# Patient Record
Sex: Female | Born: 1980 | Race: Black or African American | Hispanic: No | Marital: Single | Smoking: Current every day smoker
Health system: Southern US, Community
[De-identification: ages and names within clinical notes are randomized; demographics above are authoritative.]

## PROBLEM LIST (undated history)

## (undated) ENCOUNTER — Inpatient Hospital Stay (HOSPITAL_COMMUNITY): Payer: Self-pay

## (undated) DIAGNOSIS — N200 Calculus of kidney: Secondary | ICD-10-CM

## (undated) DIAGNOSIS — Z8619 Personal history of other infectious and parasitic diseases: Secondary | ICD-10-CM

## (undated) DIAGNOSIS — D649 Anemia, unspecified: Secondary | ICD-10-CM

## (undated) HISTORY — PX: WISDOM TOOTH EXTRACTION: SHX21

---
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
## 1999-05-21 ENCOUNTER — Emergency Department (HOSPITAL_COMMUNITY): Admission: EM | Admit: 1999-05-21 | Discharge: 1999-05-21 | Payer: Self-pay | Admitting: Emergency Medicine

## 1999-05-21 ENCOUNTER — Encounter: Payer: Self-pay | Admitting: Emergency Medicine

## 2000-07-01 ENCOUNTER — Inpatient Hospital Stay (HOSPITAL_COMMUNITY): Admission: AD | Admit: 2000-07-01 | Discharge: 2000-07-01 | Payer: Self-pay | Admitting: Obstetrics

## 2000-07-12 ENCOUNTER — Inpatient Hospital Stay (HOSPITAL_COMMUNITY): Admission: AD | Admit: 2000-07-12 | Discharge: 2000-07-12 | Payer: Self-pay | Admitting: Obstetrics

## 2001-03-29 ENCOUNTER — Inpatient Hospital Stay (HOSPITAL_COMMUNITY): Admission: AD | Admit: 2001-03-29 | Discharge: 2001-03-29 | Payer: Self-pay | Admitting: Obstetrics & Gynecology

## 2001-09-15 ENCOUNTER — Inpatient Hospital Stay (HOSPITAL_COMMUNITY): Admission: AD | Admit: 2001-09-15 | Discharge: 2001-09-15 | Payer: Self-pay | Admitting: *Deleted

## 2002-04-22 ENCOUNTER — Inpatient Hospital Stay (HOSPITAL_COMMUNITY): Admission: AD | Admit: 2002-04-22 | Discharge: 2002-04-22 | Payer: Self-pay | Admitting: Anesthesiology

## 2002-04-25 ENCOUNTER — Inpatient Hospital Stay (HOSPITAL_COMMUNITY): Admission: AD | Admit: 2002-04-25 | Discharge: 2002-04-25 | Payer: Self-pay | Admitting: Obstetrics and Gynecology

## 2002-10-01 ENCOUNTER — Inpatient Hospital Stay (HOSPITAL_COMMUNITY): Admission: AD | Admit: 2002-10-01 | Discharge: 2002-10-01 | Payer: Self-pay | Admitting: *Deleted

## 2003-01-08 ENCOUNTER — Encounter: Payer: Self-pay | Admitting: Emergency Medicine

## 2003-01-08 ENCOUNTER — Emergency Department (HOSPITAL_COMMUNITY): Admission: AD | Admit: 2003-01-08 | Discharge: 2003-01-08 | Payer: Self-pay | Admitting: Emergency Medicine

## 2003-01-15 ENCOUNTER — Emergency Department (HOSPITAL_COMMUNITY): Admission: EM | Admit: 2003-01-15 | Discharge: 2003-01-15 | Payer: Self-pay | Admitting: Emergency Medicine

## 2003-01-17 ENCOUNTER — Emergency Department (HOSPITAL_COMMUNITY): Admission: EM | Admit: 2003-01-17 | Discharge: 2003-01-17 | Payer: Self-pay | Admitting: Emergency Medicine

## 2003-06-30 ENCOUNTER — Inpatient Hospital Stay (HOSPITAL_COMMUNITY): Admission: AD | Admit: 2003-06-30 | Discharge: 2003-06-30 | Payer: Self-pay | Admitting: Obstetrics and Gynecology

## 2004-02-02 ENCOUNTER — Observation Stay (HOSPITAL_COMMUNITY): Admission: AD | Admit: 2004-02-02 | Discharge: 2004-02-03 | Payer: Self-pay | Admitting: Obstetrics & Gynecology

## 2005-05-03 ENCOUNTER — Emergency Department (HOSPITAL_COMMUNITY): Admission: EM | Admit: 2005-05-03 | Discharge: 2005-05-03 | Payer: Self-pay | Admitting: Emergency Medicine

## 2005-05-06 ENCOUNTER — Inpatient Hospital Stay (HOSPITAL_COMMUNITY): Admission: AD | Admit: 2005-05-06 | Discharge: 2005-05-06 | Payer: Self-pay | Admitting: *Deleted

## 2005-05-15 ENCOUNTER — Inpatient Hospital Stay (HOSPITAL_COMMUNITY): Admission: AD | Admit: 2005-05-15 | Discharge: 2005-05-15 | Payer: Self-pay | Admitting: *Deleted

## 2005-08-23 ENCOUNTER — Emergency Department (HOSPITAL_COMMUNITY): Admission: EM | Admit: 2005-08-23 | Discharge: 2005-08-23 | Payer: Self-pay | Admitting: Emergency Medicine

## 2006-02-16 ENCOUNTER — Inpatient Hospital Stay (HOSPITAL_COMMUNITY): Admission: AD | Admit: 2006-02-16 | Discharge: 2006-02-16 | Payer: Self-pay | Admitting: Gynecology

## 2007-01-03 ENCOUNTER — Emergency Department (HOSPITAL_COMMUNITY): Admission: EM | Admit: 2007-01-03 | Discharge: 2007-01-03 | Payer: Self-pay | Admitting: Emergency Medicine

## 2009-01-16 ENCOUNTER — Emergency Department (HOSPITAL_COMMUNITY): Admission: EM | Admit: 2009-01-16 | Discharge: 2009-01-16 | Payer: Self-pay | Admitting: Emergency Medicine

## 2009-02-15 ENCOUNTER — Emergency Department (HOSPITAL_COMMUNITY): Admission: EM | Admit: 2009-02-15 | Discharge: 2009-02-15 | Payer: Self-pay | Admitting: Emergency Medicine

## 2009-12-16 ENCOUNTER — Emergency Department (HOSPITAL_COMMUNITY): Admission: EM | Admit: 2009-12-16 | Discharge: 2009-12-16 | Payer: Self-pay | Admitting: Emergency Medicine

## 2010-12-20 LAB — CBC
HCT: 38 % (ref 36.0–46.0)
Platelets: 296 10*3/uL (ref 150–400)
RDW: 13.2 % (ref 11.5–15.5)
WBC: 12.5 10*3/uL — ABNORMAL HIGH (ref 4.0–10.5)

## 2010-12-20 LAB — URINALYSIS, ROUTINE W REFLEX MICROSCOPIC
Ketones, ur: NEGATIVE mg/dL
Nitrite: NEGATIVE
Urobilinogen, UA: 0.2 mg/dL (ref 0.0–1.0)
pH: 7 (ref 5.0–8.0)

## 2010-12-20 LAB — BASIC METABOLIC PANEL
BUN: 10 mg/dL (ref 6–23)
Calcium: 8.9 mg/dL (ref 8.4–10.5)
Creatinine, Ser: 0.81 mg/dL (ref 0.4–1.2)
GFR calc non Af Amer: >60 mL/min (ref >60)
Potassium: 3.9 mEq/L (ref 3.5–5.1)

## 2010-12-20 LAB — DIFFERENTIAL
Basophils Absolute: 0 10*3/uL (ref 0.0–0.1)
Lymphocytes Relative: 4 % — ABNORMAL LOW (ref 12–46)
Lymphs Abs: 0.4 10*3/uL — ABNORMAL LOW (ref 0.7–4.0)
Neutro Abs: 11 10*3/uL — ABNORMAL HIGH (ref 1.7–7.7)
Neutrophils Relative %: 88 % — ABNORMAL HIGH (ref 43–77)

## 2010-12-20 LAB — WET PREP, GENITAL: Yeast Wet Prep HPF POC: NONE SEEN

## 2010-12-20 LAB — POCT PREGNANCY, URINE: Preg Test, Ur: NEGATIVE

## 2010-12-20 LAB — GC/CHLAMYDIA PROBE AMP, GENITAL: GC Probe Amp, Genital: NEGATIVE

## 2011-01-28 NOTE — Discharge Summary (Signed)
Destiny Schneider, CLAIR NO.:  [id number]   MEDICAL RECORD NO.:  [id number]                   PATIENT TYPE:  OBV   LOCATION:  9310                                 FACILITY:  [facility]   PHYSICIAN:  Conni Elliot, M.D.             DATE OF BIRTH:  Jul 13, 1981   DATE OF ADMISSION:  02/02/2004  DATE OF DISCHARGE:  02/03/2004                                 DISCHARGE SUMMARY   DISCHARGE DIAGNOSIS:  Pyelonephritis.   DISCHARGE MEDICATIONS:  Avelox 400 mg p.o. daily x6 days.   FOLLOW UP:  With primary care physician as needed.  Will return to MAU for  intractable vomiting, diarrhea, or fever.   HISTORY OF PRESENT ILLNESS:  This is a 30 year old female who presented to  MAU with a history of low back pain, right-sided, dysuria, urgency, but no  frequency.  She denies fever.   HOSPITAL COURSE:  Pyelonephritis.  The patient was admitted to the floor and  started on IV antibiotics.  The fever has defervesced, and she has been  afebrile.  She feels much better, and has greatly decreased right-sided back  pain, and desires to go home.  She has been afebrile, is well appearing,  tolerated lunch, and she is stable for discharge.   LABORATORY STUDIES:  GC and Chlamydia negative.  Urine culture has been re-  incubated for better growth.  Urine pregnancy is negative.  Wet prep  revealed a few clue cells and white blood cells, moderate bacteria.  Basic  metabolic panel within normal limits.  White count 6.5, hemoglobin and  hematocrit 13.0 and 39.6, platelet count 400.  Urinalysis with positive  nitrates, large leukocyte esterase, 11-20 red blood cells, white blood cells  too numerous to count.   DISCHARGE INSTRUCTIONS:  As described above.  A total 7-day course of  Avelox.  Dr. Irene Limbo dictating for Dr. Gavin Potters.     Conni Elliot, M.D.                   Conni Elliot, M.D.    ASG/MEDQ  D:  02/03/2004  T:  02/04/2004  Job:  [id number]

## 2011-10-11 ENCOUNTER — Encounter (HOSPITAL_COMMUNITY): Payer: Self-pay | Admitting: Emergency Medicine

## 2011-10-11 ENCOUNTER — Emergency Department (HOSPITAL_COMMUNITY)
Admission: EM | Admit: 2011-10-11 | Discharge: 2011-10-11 | Disposition: A | Discharge status: Home / Self Care | Payer: Self-pay | Attending: Emergency Medicine | Admitting: Emergency Medicine

## 2011-10-11 DIAGNOSIS — R112 Nausea with vomiting, unspecified: Secondary | ICD-10-CM | POA: Insufficient documentation

## 2011-10-11 DIAGNOSIS — R109 Unspecified abdominal pain: Secondary | ICD-10-CM | POA: Insufficient documentation

## 2011-10-11 DIAGNOSIS — K921 Melena: Secondary | ICD-10-CM | POA: Insufficient documentation

## 2011-10-11 DIAGNOSIS — R6883 Chills (without fever): Secondary | ICD-10-CM | POA: Insufficient documentation

## 2011-10-11 DIAGNOSIS — R197 Diarrhea, unspecified: Secondary | ICD-10-CM | POA: Insufficient documentation

## 2011-10-11 LAB — PREGNANCY, URINE: Preg Test, Ur: NEGATIVE

## 2011-10-11 MED ORDER — ONDANSETRON HCL 4 MG/2ML IJ SOLN
4.0000 mg | Freq: Once | INTRAMUSCULAR | Status: AC
  Administered 2011-10-11: 4 mg via INTRAVENOUS
  Filled 2011-10-11: qty 2

## 2011-10-11 MED ORDER — PROMETHAZINE HCL 12.5 MG PO TABS: 12.5000 mg | ORAL_TABLET | Freq: Four times a day (QID) | ORAL | Status: AC | PRN

## 2011-10-11 MED ORDER — SODIUM CHLORIDE 0.9 % IV BOLUS (SEPSIS)
1000.0000 mL | Freq: Once | INTRAVENOUS | Status: AC
  Administered 2011-10-11: 1000 mL via INTRAVENOUS

## 2011-10-11 MED ORDER — ACETAMINOPHEN-CODEINE #3 300-30 MG PO TABS: 1.0000 | ORAL_TABLET | Freq: Four times a day (QID) | ORAL | Status: AC | PRN

## 2011-10-11 MED ORDER — SODIUM CHLORIDE 0.9 % IV SOLN
Freq: Once | INTRAVENOUS | Status: AC
Start: 2011-10-11 — End: 2011-10-11
  Administered 2011-10-11: 1000 mL via INTRAVENOUS

## 2011-10-11 MED ORDER — FENTANYL CITRATE 0.05 MG/ML IJ SOLN
50.0000 ug | Freq: Once | INTRAMUSCULAR | Status: AC
  Administered 2011-10-11: 50 ug via INTRAVENOUS
  Filled 2011-10-11: qty 2

## 2011-10-11 NOTE — ED Provider Notes (Signed)
History     CSN: [id number]  Arrival date & time 10/11/11  1004   First MD Initiated Contact with Patient 10/11/11 1115      Chief Complaint  Patient presents with  . Abdominal Pain    (Consider location/radiation/quality/duration/timing/severity/associated sxs/prior treatment) HPI Comments: Patient reports that she was in her usual state of health yesterday and had a normal home dinner. She reports other people ate the same thing and they have not had any similar symptoms. Patient reports that early this morning she had some lower abdominal crampy discomfort which she likened to she eats too much candy and has some "rumbling" of her stomach. She reports that often she will have to have a bowel movement and then feels better. She did have a bowel movement which she reported was slightly soft and tender the unusual greenish color which was slightly abnormal. However afterward she did not feel much better and continued to have cramping in her abdomen. She reports that she tried to use the bathroom a few more times and strained but did not produce any further stool. She reports from straining that she may have seen a drop of blood from her rectum when she wiped. Since then she continues to have crampy abdominal pain which is now more diffuse and has been associated with several episodes of bilious emesis. She continues to feel nauseated and reports that she has had some cold chills although she is unsure of actual fevers. She denies the crampy pain is radiating into her chest or her flank or back area. She reports her last menstrual cycle was about 2 or 3 weeks ago and that she does not think that she is pregnant. She reports she is otherwise healthy and does not take any medications. She did not take any medicines for her symptoms this morning. She denies any prior abdominal surgeries. She reports that she drinks occasional alcohol.  Patient is a 31 y.o. female presenting with abdominal pain. The  history is provided by the patient.  Abdominal Pain The primary symptoms of the illness include abdominal pain, nausea, vomiting and diarrhea. The primary symptoms of the illness do not include dysuria, vaginal discharge or vaginal bleeding.  Additional symptoms associated with the illness include chills. Symptoms associated with the illness do not include back pain.    History reviewed. No pertinent past medical history.  History reviewed. No pertinent past surgical history.  History reviewed. No pertinent family history.  History  Substance Use Topics  . Smoking status: Not on file  . Smokeless tobacco: Not on file  . Alcohol Use: 0.6 oz/week    1 Shots of liquor per week    OB History    Grav Para Term Preterm Abortions TAB SAB Ect Mult Living                  Review of Systems  Constitutional: Positive for chills and appetite change.  Gastrointestinal: Positive for nausea, vomiting, abdominal pain, diarrhea and blood in stool.  Genitourinary: Negative for dysuria, vaginal bleeding and vaginal discharge.  Musculoskeletal: Negative for back pain.  All other systems reviewed and are negative.    Allergies  Review of patient's allergies indicates no known allergies.  Home Medications   Current Outpatient Rx  Name Route Sig Dispense Refill  . IBUPROFEN 200 MG PO TABS Oral Take 400 mg by mouth every 6 (six) hours as needed. For pain      BP 110/70  Pulse 83  Temp(Src) 98  F (36.7 C) (Oral)  Resp 18  Ht 5\' 7"  (1.702 m)  Wt 120 lb (54.432 kg)  BMI 18.79 kg/m2  SpO2 100%  Physical Exam  Nursing note and vitals reviewed. Constitutional: She is oriented to person, place, and time. She appears well-developed and well-nourished.  HENT:  Head: Normocephalic and atraumatic.  Eyes: Pupils are equal, round, and reactive to light. No scleral icterus.  Neck: Normal range of motion. Neck supple.  Cardiovascular: Normal rate and regular rhythm.   Pulmonary/Chest: Effort  normal and breath sounds normal. No respiratory distress.  Abdominal: Soft. She exhibits no mass. There is tenderness. There is no rebound and no guarding.  Neurological: She is alert and oriented to person, place, and time.  Skin: Skin is warm. No rash noted. No pallor.  Psychiatric: She has a normal mood and affect.    ED Course  Procedures (including critical care time)   Labs Reviewed  PREGNANCY, URINE   No results found.   No diagnosis found.    MDM   Patient has crampy diffuse abdominal pain which I think is consistent with early gastroenteritis. I favor a virus as other family members ate the same thing that she did and she is in only one would symptoms. However treatment is the same which includes IV fluid rehydration, IV analgesics and antibiotics. My plan is to place her in the CDU under holding for continued IV fluids and reassessment for symptom control. She is able to tolerate gentle by mouth as, I feel the patient is stable to go home with prescriptions. She has no guarding or rebound on her abdominal aches examination, therefore I do not feel that any radiographs are needed at this time. She is afebrile here, was otherwise stable vital signs. I've spoken to the PA in the CDU and will continue her care as follows.          Gavin Pound. Lando Alcalde, MD 10/11/11 1152

## 2011-10-11 NOTE — ED Notes (Signed)
Awoke at 0100 hrs with N/V/D and abdominal pain.  She also reports intermittent fever and chills.  She reports that after several bowel movements she started to notice trace blood in her stool.

## 2011-10-11 NOTE — ED Notes (Signed)
Pt here from home with c/o abd pain and vomiting and diarrhea that started this am  Pt did see small amounts of blood when she had a BM

## 2011-10-11 NOTE — ED Notes (Signed)
Report given and care endorsed to [facility] an dpt moved to CDU.

## 2011-10-11 NOTE — ED Provider Notes (Signed)
Medical screening examination/treatment/procedure(s) were conducted as a shared visit with non-physician practitioner(s) and myself.  I personally evaluated the patient during the encounter Please see my full H&P note  Destiny Schneider. Renay Crammer, MD 10/11/11 1958

## 2011-10-11 NOTE — ED Provider Notes (Signed)
Pt seen and examined by me in CDU. Pt with abdominal cramping, nausea, vomiting, diarrhea. Pt placed in CDU for rehydration and vomiting control. Pt is in NAD. Fentanyl and zofran given for pain and nausea. Fluids running. Will reasses. VS normal. Abdomen soft, non tender.  1:49 PM Pt feeling better after medications and 2L on NS. Tolerating POs. Abdominal pain resolved. No vomiting or diarrhea. Will d/c home.   Lottie Mussel, PA 10/11/11 1350

## 2012-05-26 ENCOUNTER — Encounter (HOSPITAL_COMMUNITY): Payer: Self-pay | Admitting: *Deleted

## 2012-05-26 ENCOUNTER — Other Ambulatory Visit: Payer: Self-pay

## 2012-05-26 ENCOUNTER — Emergency Department (HOSPITAL_COMMUNITY)
Admission: EM | Admit: 2012-05-26 | Discharge: 2012-05-26 | Disposition: A | Discharge status: Home / Self Care | Payer: Self-pay | Attending: Emergency Medicine | Admitting: Emergency Medicine

## 2012-05-26 ENCOUNTER — Emergency Department (HOSPITAL_COMMUNITY): Payer: Self-pay

## 2012-05-26 DIAGNOSIS — J209 Acute bronchitis, unspecified: Secondary | ICD-10-CM | POA: Insufficient documentation

## 2012-05-26 LAB — POCT I-STAT, CHEM 8
Calcium, Ion: 1.14 mmol/L (ref 1.12–1.23)
Chloride: 106 mEq/L (ref 96–112)
Glucose, Bld: 115 mg/dL — ABNORMAL HIGH (ref 70–99)
HCT: 40 % (ref 36.0–46.0)
TCO2: 21 mmol/L (ref 0–100)

## 2012-05-26 LAB — POCT I-STAT TROPONIN I: Troponin i, poc: 0 ng/mL (ref 0.00–0.08)

## 2012-05-26 LAB — CBC
HCT: 36.4 % (ref 36.0–46.0)
MCH: 32.5 pg (ref 26.0–34.0)
MCV: 90.3 fL (ref 78.0–100.0)
Platelets: 272 10*3/uL (ref 150–400)
RDW: 12.6 % (ref 11.5–15.5)

## 2012-05-26 MED ORDER — ALBUTEROL SULFATE HFA 108 (90 BASE) MCG/ACT IN AERS
2.0000 | INHALATION_SPRAY | RESPIRATORY_TRACT | Status: DC | PRN
  Administered 2012-05-26: 2 via RESPIRATORY_TRACT
  Filled 2012-05-26: qty 6.7

## 2012-05-26 NOTE — ED Provider Notes (Addendum)
History     CSN: [id number]  Arrival date & time 05/26/12  2001   First MD Initiated Contact with Patient 05/26/12 2121      Chief Complaint  Patient presents with  . Chills    (Consider location/radiation/quality/duration/timing/severity/associated sxs/prior treatment) HPI Complains of cough productive of green sputum chills and myalgias onset 2 days ago. Treated with Advil and Robitussin with partial relief. Denies shortness of breath denies vomiting no other complaint nothing makes symptoms better or worse. History reviewed. No pertinent past medical history. Medical history negative History reviewed. No pertinent past surgical history.  History reviewed. No pertinent family history.  History  Substance Use Topics  . Smoking status: Not on file  . Smokeless tobacco: Not on file  . Alcohol Use: 0.6 oz/week    1 Shots of liquor per week   social history occasional alcohol positive smoker no illicit drug use  OB History    Grav Para Term Preterm Abortions TAB SAB Ect Mult Living                  Review of Systems  Constitutional: Positive for chills.  HENT: Negative.   Respiratory: Positive for cough.   Cardiovascular: Negative.   Gastrointestinal: Negative.   Musculoskeletal: Positive for myalgias.  Skin: Negative.   Neurological: Negative.   Hematological: Negative.   Psychiatric/Behavioral: Negative.     Allergies  Review of patient's allergies indicates no known allergies.  Home Medications   Current Outpatient Rx  Name Route Sig Dispense Refill  . DEXTROMETHORPHAN-GUAIFENESIN 10-200 MG/5ML PO LIQD Oral Take 30 mLs by mouth every 4 (four) hours as needed. For congestion    . IBUPROFEN 200 MG PO TABS Oral Take 400 mg by mouth every 6 (six) hours as needed. For pain      BP 166/80  Pulse 120  Temp 99.1 F (37.3 C) (Oral)  Resp 20  SpO2 99%  LMP 05/26/2012  Physical Exam  Nursing note and vitals reviewed. Constitutional: She appears  well-developed and well-nourished.  HENT:  Head: Normocephalic and atraumatic.  Eyes: Conjunctivae normal are normal. Pupils are equal, round, and reactive to light.  Neck: Neck supple. No tracheal deviation present. No thyromegaly present.  Cardiovascular: Normal rate and regular rhythm.   No murmur heard. Pulmonary/Chest: Effort normal.       No respiratory distress diffuse rhonchi  Abdominal: Soft. Bowel sounds are normal. She exhibits no distension. There is no tenderness.  Musculoskeletal: Normal range of motion. She exhibits no edema and no tenderness.  Neurological: She is alert. Coordination normal.  Skin: Skin is warm and dry. No rash noted.  Psychiatric: She has a normal mood and affect.    ED Course  Procedures (including critical care time)  Labs Reviewed  CBC - Abnormal; Notable for the following:    WBC 13.0 (*)     All other components within normal limits  POCT I-STAT, CHEM 8 - Abnormal; Notable for the following:    Glucose, Bld 115 (*)     All other components within normal limits  POCT I-STAT TROPONIN I   No results found.   No diagnosis found.   Results for orders placed during the hospital encounter of 05/26/12  CBC      Component Value Range   WBC 13.0 (*) 4.0 - 10.5 K/uL   RBC 4.03  3.87 - 5.11 MIL/uL   Hemoglobin 13.1  12.0 - 15.0 g/dL   HCT 56.2  13.0 - 86.5 %  MCV 90.3  78.0 - 100.0 fL   MCH 32.5  26.0 - 34.0 pg   MCHC 36.0  30.0 - 36.0 g/dL   RDW 40.9  81.1 - 91.4 %   Platelets 272  150 - 400 K/uL  POCT I-STAT, CHEM 8      Component Value Range   Sodium 139  135 - 145 mEq/L   Potassium 3.6  3.5 - 5.1 mEq/L   Chloride 106  96 - 112 mEq/L   BUN 16  6 - 23 mg/dL   Creatinine, Ser 7.82  0.50 - 1.10 mg/dL   Glucose, Bld 956 (*) 70 - 99 mg/dL   Calcium, Ion 2.13  0.86 - 1.23 mmol/L   TCO2 21  0 - 100 mmol/L   Hemoglobin 13.6  12.0 - 15.0 g/dL   HCT 57.8  46.9 - 62.9 %  POCT I-STAT TROPONIN I      Component Value Range   Troponin i, poc  0.00  0.00 - 0.08 ng/mL   Comment 3            Dg Chest 2 View  05/26/2012  *RADIOLOGY REPORT*  Clinical Data: Chills, cough.  CHEST - 2 VIEW  Comparison: 01/03/2007  Findings: Cardiomediastinal contours within normal range.  Mild peribronchial thickening is of similar appearance to prior.  No confluent airspace opacity.  No pleural effusion or pneumothorax. Mild leftward curvature of the lumbar spine.  No acute osseous finding.  IMPRESSION: Mild peribronchial thickening appears similar to comparison. May reflect acute and/or chronic bronchitic change.  No focal consolidation.   Original Report Authenticated By: Waneta Martins, M.D.    Xray reviewed by me MDM   Symptoms and exam consistent with bronchitis Plan smoking cessation encouraged.Spent 5 minutes councilling pt on smoking cessation. Albuterol inhaler when necessary Continue Advil and Robitussin as needed Referral resource guide Diagnosis #1 acute bronchitis  #2 tobacco abuse        Doug Sou, MD 05/26/12 2209  Doug Sou, MD 05/26/12 2213

## 2012-05-26 NOTE — ED Notes (Signed)
Patient with cold like symptoms for a few days.  States that she is experiencing nigh chills and her chest area is sore.  Has been coughing up greenish mucous.  Patient denies fever at home

## 2012-05-26 NOTE — ED Notes (Signed)
Pt d/c home in NAD. Pt voiced understanding of d/c instructions and follow up care.  

## 2012-05-26 NOTE — ED Notes (Signed)
Pt states she has been having soreness in chest, has been coughing up green mucus, and feeling generally sick for 3 days. Denies SOB. Pt has been taking robitussin, advil and nyquil for sx, no relief.

## 2012-07-18 ENCOUNTER — Emergency Department (HOSPITAL_COMMUNITY)
Admission: EM | Admit: 2012-07-18 | Discharge: 2012-07-18 | Disposition: A | Discharge status: Home / Self Care | Payer: Self-pay | Attending: Emergency Medicine | Admitting: Emergency Medicine

## 2012-07-18 ENCOUNTER — Encounter (HOSPITAL_COMMUNITY): Payer: Self-pay | Admitting: *Deleted

## 2012-07-18 DIAGNOSIS — K219 Gastro-esophageal reflux disease without esophagitis: Secondary | ICD-10-CM | POA: Insufficient documentation

## 2012-07-18 MED ORDER — OMEPRAZOLE 20 MG PO CPDR: 40.0000 mg | DELAYED_RELEASE_CAPSULE | Freq: Every day | ORAL | Status: DC

## 2012-07-18 NOTE — ED Notes (Signed)
Pt reports sleep with a fan this past weekend and developed a sore throat. States that she now is experiencing difficulty swallowing. States she has no difficulty breathing, speaking, or eat and drink but with pain.

## 2012-07-18 NOTE — ED Provider Notes (Signed)
History  This chart was scribed for Celene Kras, MD by Albertha Ghee Rifaie. This patient was seen in room TR04C/TR04C and the patient's care was started at 5:50 PM.  CSN: [id number] Arrival date & time 07/18/12  1637   Chief Complaint  Patient presents with  . Sore Throat    The history is provided by the patient. No language interpreter was used.   Destiny Schneider is a 31 y.o. female who presents to the Emergency Department complaining of moderate, intermittent, dull sore throat and increased pain with swallowing onset 4 days ago. There is associated burping. She denies any aggravating or modifying factors. Patient denies cough, SOB, difficulty speaking, fever, rhinorrhea, or congestion. She denies any other medical, surgical, or family history.    History  Substance Use Topics  . Smoking status: Not on file  . Smokeless tobacco: Not on file  . Alcohol Use: 0.6 oz/week    1 Shots of liquor per week   No OB history provided.  Review of Systems  HENT: Positive for sore throat. Negative for congestion, rhinorrhea and trouble swallowing.   Respiratory: Negative for cough.   Neurological: Negative for speech difficulty.   Allergies  Review of patient's allergies indicates no known allergies.  Home Medications  No current outpatient prescriptions on file.  BP 122/73  Pulse 71  Temp 98.5 F (36.9 C) (Oral)  Resp 18  Ht 5' 6.5" (1.689 m)  Wt 120 lb (54.432 kg)  BMI 19.08 kg/m2  SpO2 100%  Physical Exam  Nursing note and vitals reviewed. Constitutional: She appears well-developed and well-nourished. No distress.  HENT:  Head: Normocephalic and atraumatic.  Right Ear: External ear normal.  Left Ear: External ear normal.  Mouth/Throat: Oropharynx is clear and moist. No oropharyngeal exudate, posterior oropharyngeal edema, posterior oropharyngeal erythema or tonsillar abscesses.  Eyes: Conjunctivae normal are normal. Right eye exhibits no discharge. Left eye exhibits no  discharge. No scleral icterus.  Neck: Neck supple. No tracheal deviation present.  Cardiovascular: Normal rate, regular rhythm and intact distal pulses.   Pulmonary/Chest: Effort normal and breath sounds normal. No stridor. No respiratory distress. She has no wheezes. She has no rales.  Abdominal: Soft. Bowel sounds are normal. She exhibits no distension. There is no tenderness. There is no rebound and no guarding.  Musculoskeletal: She exhibits no edema and no tenderness.  Neurological: She is alert. She has normal strength. No sensory deficit. Cranial nerve deficit:  no gross defecits noted. She exhibits normal muscle tone. She displays no seizure activity. Coordination normal.  Skin: Skin is warm and dry. No rash noted.  Psychiatric: She has a normal mood and affect.   ED Course  Procedures (including critical care time) DIAGNOSTIC STUDIES: Oxygen Saturation is 100% on room air, normal by my interpretation.    COORDINATION OF CARE: 5:50 PM Discussed treatment plan with pt at bedside and pt agreed to plan.  1. GERD (gastroesophageal reflux disease)     MDM  Likely GERD.  Will dc home on antacids.  Consider maalox.  Follow up with PCP if not improving.  I personally performed the services described in this documentation, which was scribed in my presence.  The recorded information has been reviewed and considered.    Celene Kras, MD 07/18/12 [id number]

## 2014-07-29 ENCOUNTER — Encounter (HOSPITAL_COMMUNITY): Payer: Self-pay

## 2014-07-29 ENCOUNTER — Emergency Department (HOSPITAL_COMMUNITY)
Admission: EM | Admit: 2014-07-29 | Discharge: 2014-07-29 | Disposition: A | Discharge status: Home / Self Care | Payer: No Typology Code available for payment source | Attending: Emergency Medicine | Admitting: Emergency Medicine

## 2014-07-29 DIAGNOSIS — Y9389 Activity, other specified: Secondary | ICD-10-CM | POA: Diagnosis not present

## 2014-07-29 DIAGNOSIS — Y92410 Unspecified street and highway as the place of occurrence of the external cause: Secondary | ICD-10-CM | POA: Insufficient documentation

## 2014-07-29 DIAGNOSIS — S199XXA Unspecified injury of neck, initial encounter: Secondary | ICD-10-CM | POA: Diagnosis not present

## 2014-07-29 DIAGNOSIS — Z791 Long term (current) use of non-steroidal anti-inflammatories (NSAID): Secondary | ICD-10-CM | POA: Diagnosis not present

## 2014-07-29 DIAGNOSIS — M62830 Muscle spasm of back: Secondary | ICD-10-CM

## 2014-07-29 DIAGNOSIS — M549 Dorsalgia, unspecified: Secondary | ICD-10-CM

## 2014-07-29 DIAGNOSIS — Z720 Tobacco use: Secondary | ICD-10-CM | POA: Diagnosis not present

## 2014-07-29 DIAGNOSIS — Y998 Other external cause status: Secondary | ICD-10-CM | POA: Diagnosis not present

## 2014-07-29 DIAGNOSIS — S3992XA Unspecified injury of lower back, initial encounter: Secondary | ICD-10-CM | POA: Insufficient documentation

## 2014-07-29 DIAGNOSIS — Z79899 Other long term (current) drug therapy: Secondary | ICD-10-CM | POA: Diagnosis not present

## 2014-07-29 MED ORDER — HYDROCODONE-ACETAMINOPHEN 5-325 MG PO TABS: 1.0000 | ORAL_TABLET | ORAL | Status: DC | PRN

## 2014-07-29 MED ORDER — DIAZEPAM 5 MG PO TABS: 5.0000 mg | ORAL_TABLET | Freq: Two times a day (BID) | ORAL | Status: DC

## 2014-07-29 MED ORDER — IBUPROFEN 800 MG PO TABS: 800.0000 mg | ORAL_TABLET | Freq: Three times a day (TID) | ORAL | Status: DC
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
## 2015-05-28 ENCOUNTER — Encounter (HOSPITAL_COMMUNITY): Payer: Self-pay | Admitting: Emergency Medicine

## 2015-05-28 ENCOUNTER — Emergency Department (HOSPITAL_COMMUNITY)
Admission: EM | Admit: 2015-05-28 | Discharge: 2015-05-28 | Disposition: A | Discharge status: Home / Self Care | Payer: Medicaid Other | Attending: Emergency Medicine | Admitting: Emergency Medicine

## 2015-05-28 DIAGNOSIS — A598 Trichomoniasis of other sites: Secondary | ICD-10-CM | POA: Insufficient documentation

## 2015-05-28 DIAGNOSIS — Z791 Long term (current) use of non-steroidal anti-inflammatories (NSAID): Secondary | ICD-10-CM | POA: Diagnosis not present

## 2015-05-28 DIAGNOSIS — Z79899 Other long term (current) drug therapy: Secondary | ICD-10-CM | POA: Insufficient documentation

## 2015-05-28 DIAGNOSIS — Z720 Tobacco use: Secondary | ICD-10-CM | POA: Diagnosis not present

## 2015-05-28 DIAGNOSIS — N72 Inflammatory disease of cervix uteri: Secondary | ICD-10-CM | POA: Diagnosis not present

## 2015-05-28 DIAGNOSIS — R1030 Lower abdominal pain, unspecified: Secondary | ICD-10-CM | POA: Diagnosis present

## 2015-05-28 DIAGNOSIS — A5909 Other urogenital trichomoniasis: Secondary | ICD-10-CM

## 2015-05-28 LAB — URINALYSIS, ROUTINE W REFLEX MICROSCOPIC
GLUCOSE, UA: NEGATIVE mg/dL
KETONES UR: 40 mg/dL — AB
Leukocytes, UA: NEGATIVE
Nitrite: NEGATIVE
Protein, ur: NEGATIVE mg/dL
Specific Gravity, Urine: 1.025 (ref 1.005–1.030)
UROBILINOGEN UA: 0.2 mg/dL (ref 0.0–1.0)
pH: 6 (ref 5.0–8.0)

## 2015-05-28 LAB — URINE MICROSCOPIC-ADD ON

## 2015-05-28 LAB — WET PREP, GENITAL: YEAST WET PREP: NONE SEEN

## 2015-05-28 LAB — POC URINE PREG, ED: Preg Test, Ur: NEGATIVE

## 2015-05-28 MED ORDER — LIDOCAINE HCL (PF) 1 % IJ SOLN
INTRAMUSCULAR | Status: AC
  Administered 2015-05-28: 5 mL
  Filled 2015-05-28: qty 5

## 2015-05-28 MED ORDER — AZITHROMYCIN 250 MG PO TABS
1000.0000 mg | ORAL_TABLET | Freq: Once | ORAL | Status: AC
  Administered 2015-05-28: 1000 mg via ORAL
  Filled 2015-05-28: qty 4

## 2015-05-28 MED ORDER — METRONIDAZOLE 500 MG PO TABS
2000.0000 mg | ORAL_TABLET | Freq: Once | ORAL | Status: AC
  Administered 2015-05-28: 2000 mg via ORAL
  Filled 2015-05-28: qty 4

## 2015-05-28 MED ORDER — CEFTRIAXONE SODIUM 250 MG IJ SOLR
250.0000 mg | Freq: Once | INTRAMUSCULAR | Status: AC
  Administered 2015-05-28: 250 mg via INTRAMUSCULAR
  Filled 2015-05-28: qty 250

## 2015-05-28 NOTE — ED Provider Notes (Signed)
CSN: [id number]     Arrival date & time 05/28/15  1248 History  This chart was scribed for non-physician practitioner Jaynie Crumble, PA-C, working with Geoffery Lyons, MD, by Tanda Rockers, ED Scribe. This patient was seen in room TR11C/TR11C and the patient's care was started at 2:56 PM.  Chief Complaint  Patient presents with  . Abdominal Pain   The history is provided by the patient. No language interpreter was used.     HPI Comments: Destiny Schneider is a 34 y.o. female who presents to the Emergency Department complaining of sudden onset, intermittent, 9/10, lower abdominal pain x 2 days. Pt also complains of white, thick, foul smelling vaginal discharge that she noticed around the same time as the abdominal pain. She mentions that she had intercourse 2 weeks ago and used a condom. Pt states that yesterday she noticed the condom was still inside of her and pulled it out. Denies fever, chills, back pain, dysuria, frequency, urgency, or any other associated symptoms. LNMP: 05/14/2015. Pt had her menstrual cycle a couple of days after having intercourse.    History reviewed. No pertinent past medical history. History reviewed. No pertinent past surgical history. History reviewed. No pertinent family history. Social History  Substance Use Topics  . Smoking status: Current Every Day Smoker -- 0.50 packs/day  . Smokeless tobacco: None  . Alcohol Use: 0.6 oz/week    1 Shots of liquor per week   OB History    No data available     Review of Systems  Constitutional: Negative for fever and chills.  Gastrointestinal: Positive for abdominal pain.  Genitourinary: Positive for vaginal discharge. Negative for dysuria, urgency and frequency.  Musculoskeletal: Negative for back pain.   Allergies  Review of patient's allergies indicates no known allergies.  Home Medications   Prior to Admission medications   Medication Sig Start Date End Date Taking? Authorizing Provider  albuterol  (PROVENTIL HFA;VENTOLIN HFA) 108 (90 BASE) MCG/ACT inhaler Inhale 1-2 puffs into the lungs every 6 (six) hours as needed for wheezing or shortness of breath.    Historical Provider, MD  diazepam (VALIUM) 5 MG tablet Take 1 tablet (5 mg total) by mouth 2 (two) times daily. 07/29/14   Kathrynn Speed, PA-C  HYDROcodone-acetaminophen (NORCO/VICODIN) 5-325 MG per tablet Take 1-2 tablets by mouth every 4 (four) hours as needed. 07/29/14   Robyn M Hess, PA-C  ibuprofen (ADVIL,MOTRIN) 800 MG tablet Take 1 tablet (800 mg total) by mouth 3 (three) times daily. 07/29/14   Robyn M Hess, PA-C  Ibuprofen-Diphenhydramine Cit (ADVIL PM PO) Take 1 tablet by mouth at bedtime as needed (sleep).    Historical Provider, MD  omeprazole (PRILOSEC) 20 MG capsule Take 2 capsules (40 mg total) by mouth daily. 07/18/12   Linwood Dibbles, MD   Triage Vitals: BP 113/93 mmHg  Pulse 90  Temp(Src) 98.7 F (37.1 C) (Oral)  Resp 16  Ht  (1.676 m)  Wt 124 lb (56.246 kg)  BMI 20.02 kg/m2  SpO2 98%   Physical Exam  Constitutional: She is oriented to person, place, and time. She appears well-developed and well-nourished. No distress.  HENT:  Head: Normocephalic and atraumatic.  Eyes: Conjunctivae and EOM are normal.  Neck: Neck supple. No tracheal deviation present.  Cardiovascular: Normal rate and regular rhythm.   Pulmonary/Chest: Effort normal and breath sounds normal. No respiratory distress. She has no wheezes. She has no rales.  Abdominal: Soft. Bowel sounds are normal. She exhibits no distension. There  is no tenderness. There is no rebound.  Genitourinary:  Normal external genitalia. Normal vaginal canal. Large thin white discharge. Cervix is normal, closed. No CMT. No uterine or adnexal tenderness. No masses palpated.    Musculoskeletal: Normal range of motion.  Neurological: She is alert and oriented to person, place, and time.  Skin: Skin is warm and dry.  Psychiatric: She has a normal mood and affect. Her behavior  is normal.  Nursing note and vitals reviewed.   ED Course  Procedures (including critical care time)  DIAGNOSTIC STUDIES: Oxygen Saturation is 98% on RA, normal by my interpretation.    COORDINATION OF CARE: 3:01 PM-Discussed treatment plan which includes pelvic exam, wet prep, with pt at bedside and pt agreed to plan.   Labs Review Labs Reviewed  URINALYSIS, ROUTINE W REFLEX MICROSCOPIC (NOT AT [facility]) - Abnormal; Notable for the following:    Hgb urine dipstick TRACE (*)    Bilirubin Urine SMALL (*)    Ketones, ur 40 (*)    All other components within normal limits  URINE MICROSCOPIC-ADD ON - Abnormal; Notable for the following:    Squamous Epithelial / LPF FEW (*)    Bacteria, UA MANY (*)    All other components within normal limits  POC URINE PREG, ED    Imaging Review No results found. I have personally reviewed and evaluated these lab results as part of my medical decision-making.   EKG Interpretation None      MDM   Final diagnoses:  Trichomonal cervicitis  Cervicitis   Patient emergency department with vaginal discharge. Patient states that she had intercourse with her partner 2 weeks ago, and just few days realized that the condom broke during intercourse and has been in her vaginal canal until she solids past while using a bathroom. She denies fever or chills. She reports increased white thin vaginal discharge. She admits to some abdominal cramping. Denies any other complaints. We will get a pelvic exam done, urinalysis, pregnancy test. Patient does not have any urinary symptoms.  3:54 PM Patient's pelvic exam shows large amount of white thin discharge. She has no cervical motion tenderness, no adnexal tenderness or uterine tenderness. Her wet prep is positive for Trichomonas and moderate white blood cells noted on the smear. We will treat her for an STI. I will give her Flagyl 2 g, Zithromax 1 g, Rocephin 250 mg IM. GC and Chlamydia culture sent. Patient  urinalysis shows many bacteria, with negative nitrite and leukocyte esterase. There are few pleural cells. This is most likely contaminated sample given patient has no urinary symptoms. Patient instructed to follow-up with primary care doctor or OB/GYN if her symptoms do not clear up. Instructed to make sure the partner Street as well. At this time I do not think patient has PID given no abdominal tenderness, no cervical motion tenderness, her being afebrile.  Filed Vitals:   05/28/15 1304  BP: 113/93  Pulse: 90  Temp: 98.7 F (37.1 C)  TempSrc: Oral  Resp: 16  Height: 5\' 6"  (1.676 m)  Weight: 124 lb (56.246 kg)  SpO2: 98%   I personally performed the services described in this documentation, which was scribed in my presence. The recorded information has been reviewed and is accurate.   Jaynie Crumble, PA-C 05/28/15 2241  Geoffery Lyons, MD 05/29/15 1110

## 2015-05-28 NOTE — Discharge Instructions (Signed)
You were treated today for a vaginal infection. No intercourse for a week. Make sure your partner is treated as well. If your gonorrhea/chlamydia cultures return positive you will be contacted.   Trichomoniasis Trichomoniasis is an infection caused by an organism called Trichomonas. The infection can affect both women and men. In women, the outer female genitalia and the vagina are affected. In men, the penis is mainly affected, but the prostate and other reproductive organs can also be involved. Trichomoniasis is a sexually transmitted infection (STI) and is most often passed to another person through sexual contact.  RISK FACTORS  Having unprotected sexual intercourse.  Having sexual intercourse with an infected partner. SIGNS AND SYMPTOMS  Symptoms of trichomoniasis in women include:  Abnormal gray-green frothy vaginal discharge.  Itching and irritation of the vagina.  Itching and irritation of the area outside the vagina. Symptoms of trichomoniasis in men include:   Penile discharge with or without pain.  Pain during urination. This results from inflammation of the urethra. DIAGNOSIS  Trichomoniasis may be found during a Pap test or physical exam. Your health care provider may use one of the following methods to help diagnose this infection:  Examining vaginal discharge under a microscope. For men, urethral discharge would be examined.  Testing the pH of the vagina with a test tape.  Using a vaginal swab test that checks for the Trichomonas organism. A test is available that provides results within a few minutes.  Doing a culture test for the organism. This is not usually needed. TREATMENT   You may be given medicine to fight the infection. Women should inform their health care provider if they could be or are pregnant. Some medicines used to treat the infection should not be taken during pregnancy.  Your health care provider may recommend over-the-counter medicines or creams  to decrease itching or irritation.  Your sexual partner will need to be treated if infected. HOME CARE INSTRUCTIONS   Take medicines only as directed by your health care provider.  Take over-the-counter medicine for itching or irritation as directed by your health care provider.  Do not have sexual intercourse while you have the infection.  Women should not douche or wear tampons while they have the infection.  Discuss your infection with your partner. Your partner may have gotten the infection from you, or you may have gotten it from your partner.  Have your sex partner get examined and treated if necessary.  Practice safe, informed, and protected sex.  See your health care provider for other STI testing. SEEK MEDICAL CARE IF:   You still have symptoms after you finish your medicine.  You develop abdominal pain.  You have pain when you urinate.  You have bleeding after sexual intercourse.  You develop a rash.  Your medicine makes you sick or makes you throw up (vomit). MAKE SURE YOU:  Understand these instructions.  Will watch your condition.  Will get help right away if you are not doing well or get worse. Document Released: 02/22/2001 Document Revised: 01/13/2014 Document Reviewed: 06/10/2013 [facility] Patient Information 2015 [place], [place]. This information is not intended to replace advice given to you by your health care provider. Make sure you discuss any questions you have with your health care provider.

## 2015-05-28 NOTE — ED Notes (Signed)
Pt stable, ambulatory, states understanding of discharge instructions 

## 2015-05-28 NOTE — ED Notes (Signed)
Pt sts lower abd pain with white vaginal discharge x 3 days; pt sts LMP was 05/14/15

## 2015-05-29 LAB — GC/CHLAMYDIA PROBE AMP (~~LOC~~) NOT AT ARMC
Chlamydia: NEGATIVE
Neisseria Gonorrhea: NEGATIVE

## 2015-07-21 ENCOUNTER — Emergency Department (HOSPITAL_COMMUNITY)
Admission: EM | Admit: 2015-07-21 | Discharge: 2015-07-21 | Disposition: A | Discharge status: Home / Self Care | Payer: Self-pay | Attending: Emergency Medicine | Admitting: Emergency Medicine

## 2015-07-21 ENCOUNTER — Emergency Department (HOSPITAL_COMMUNITY): Payer: Medicaid Other

## 2015-07-21 ENCOUNTER — Encounter (HOSPITAL_COMMUNITY): Payer: Self-pay

## 2015-07-21 DIAGNOSIS — N200 Calculus of kidney: Secondary | ICD-10-CM | POA: Insufficient documentation

## 2015-07-21 DIAGNOSIS — Z720 Tobacco use: Secondary | ICD-10-CM | POA: Insufficient documentation

## 2015-07-21 LAB — URINE MICROSCOPIC-ADD ON

## 2015-07-21 LAB — URINALYSIS, ROUTINE W REFLEX MICROSCOPIC
Bilirubin Urine: NEGATIVE
GLUCOSE, UA: NEGATIVE mg/dL
KETONES UR: 40 mg/dL — AB
Nitrite: NEGATIVE
Protein, ur: 30 mg/dL — AB
Specific Gravity, Urine: 1.02 (ref 1.005–1.030)
UROBILINOGEN UA: 1 mg/dL (ref 0.0–1.0)
pH: 6 (ref 5.0–8.0)

## 2015-07-21 LAB — COMPREHENSIVE METABOLIC PANEL
ALBUMIN: 3.4 g/dL — AB (ref 3.5–5.0)
ALT: 74 U/L — ABNORMAL HIGH (ref 14–54)
ANION GAP: 11 (ref 5–15)
AST: 70 U/L — ABNORMAL HIGH (ref 15–41)
Alkaline Phosphatase: 52 U/L (ref 38–126)
BILIRUBIN TOTAL: 0.6 mg/dL (ref 0.3–1.2)
BUN: 10 mg/dL (ref 6–20)
CHLORIDE: 105 mmol/L (ref 101–111)
CO2: 24 mmol/L (ref 22–32)
Calcium: 8.8 mg/dL — ABNORMAL LOW (ref 8.9–10.3)
Creatinine, Ser: 0.69 mg/dL (ref 0.44–1.00)
GFR calc Af Amer: >60 mL/min (ref >60)
GFR calc non Af Amer: >60 mL/min (ref >60)
GLUCOSE: 115 mg/dL — AB (ref 65–99)
POTASSIUM: 3.3 mmol/L — AB (ref 3.5–5.1)
Sodium: 140 mmol/L (ref 135–145)
TOTAL PROTEIN: 6.7 g/dL (ref 6.5–8.1)

## 2015-07-21 LAB — CBC WITH DIFFERENTIAL/PLATELET
BASOS PCT: 0 %
Basophils Absolute: 0 10*3/uL (ref 0.0–0.1)
Eosinophils Absolute: 0 10*3/uL (ref 0.0–0.7)
Eosinophils Relative: 0 %
HCT: 34.6 % — ABNORMAL LOW (ref 36.0–46.0)
Hemoglobin: 11.9 g/dL — ABNORMAL LOW (ref 12.0–15.0)
Lymphocytes Relative: 8 %
Lymphs Abs: 0.8 10*3/uL (ref 0.7–4.0)
MCH: 31.5 pg (ref 26.0–34.0)
MCHC: 34.4 g/dL (ref 30.0–36.0)
MCV: 91.5 fL (ref 78.0–100.0)
MONO ABS: 1.3 10*3/uL — AB (ref 0.1–1.0)
Monocytes Relative: 13 %
NEUTROS ABS: 8.2 10*3/uL — AB (ref 1.7–7.7)
Neutrophils Relative %: 79 %
PLATELETS: 294 10*3/uL (ref 150–400)
RBC: 3.78 MIL/uL — ABNORMAL LOW (ref 3.87–5.11)
RDW: 12.6 % (ref 11.5–15.5)
WBC: 10.3 10*3/uL (ref 4.0–10.5)

## 2015-07-21 LAB — PREGNANCY, URINE: PREG TEST UR: NEGATIVE

## 2015-07-21 MED ORDER — OXYCODONE-ACETAMINOPHEN 5-325 MG PO TABS: 1.0000 | ORAL_TABLET | ORAL | Status: DC | PRN

## 2015-07-21 MED ORDER — OXYCODONE-ACETAMINOPHEN 5-325 MG PO TABS
1.0000 | ORAL_TABLET | Freq: Once | ORAL | Status: DC
  Filled 2015-07-21: qty 1

## 2015-07-21 MED ORDER — KETOROLAC TROMETHAMINE 30 MG/ML IJ SOLN
30.0000 mg | Freq: Once | INTRAMUSCULAR | Status: AC
Start: 2015-07-21 — End: 2015-07-21
  Administered 2015-07-21: 30 mg via INTRAVENOUS

## 2015-07-21 MED ORDER — CIPROFLOXACIN HCL 500 MG PO TABS
500.0000 mg | ORAL_TABLET | Freq: Once | ORAL | Status: AC
  Administered 2015-07-21: 500 mg via ORAL
  Filled 2015-07-21: qty 1

## 2015-07-21 MED ORDER — TAMSULOSIN HCL 0.4 MG PO CAPS: 0.4000 mg | ORAL_CAPSULE | Freq: Every day | ORAL | Status: DC

## 2015-07-21 MED ORDER — IBUPROFEN 600 MG PO TABS: 600.0000 mg | ORAL_TABLET | Freq: Four times a day (QID) | ORAL | Status: DC | PRN

## 2015-07-21 MED ORDER — CIPROFLOXACIN HCL 500 MG PO TABS: 500.0000 mg | ORAL_TABLET | Freq: Two times a day (BID) | ORAL | Status: DC

## 2015-07-21 MED ORDER — KETOROLAC TROMETHAMINE 30 MG/ML IJ SOLN
60.0000 mg | Freq: Once | INTRAMUSCULAR | Status: DC
  Filled 2015-07-21: qty 2

## 2015-07-21 NOTE — ED Notes (Signed)
Patient here with 9 days of left flank pain. Think she has kidney infection. Has been increasing her water intake with no relief, denies dysuria, no frequency

## 2015-07-21 NOTE — ED Provider Notes (Addendum)
CSN: [id number]     Arrival date & time 07/21/15  0820 History   First MD Initiated Contact with Patient 07/21/15 [phone]     Chief Complaint  Patient presents with  . Back Pain     (Consider location/radiation/quality/duration/timing/severity/associated sxs/prior Treatment) HPI Comments: Patient presents here with pain in her right upper back. She states it hurt about a week ago for a couple days and then started hurting again yesterday. She states it's not related to movement. She denies any injuries. There is no abdominal pain. She did have some vomiting earlier today. She denies any urinary symptoms. There is no fevers or chills. She states she's had kidney problems in the past and is still similar.  Patient is a 34 y.o. female presenting with back pain.  Back Pain Associated symptoms: no abdominal pain, no chest pain, no fever, no headaches, no numbness and no weakness     History reviewed. No pertinent past medical history. History reviewed. No pertinent past surgical history. No family history on file. Social History  Substance Use Topics  . Smoking status: Current Every Day Smoker -- 0.50 packs/day  . Smokeless tobacco: None  . Alcohol Use: 0.6 oz/week    1 Shots of liquor per week   OB History    No data available     Review of Systems  Constitutional: Negative for fever, chills, diaphoresis and fatigue.  HENT: Negative for congestion, rhinorrhea and sneezing.   Eyes: Negative.   Respiratory: Negative for cough, chest tightness and shortness of breath.   Cardiovascular: Negative for chest pain and leg swelling.  Gastrointestinal: Positive for nausea and vomiting. Negative for abdominal pain, diarrhea and blood in stool.  Genitourinary: Negative for frequency, hematuria, flank pain and difficulty urinating.  Musculoskeletal: Positive for back pain. Negative for arthralgias.  Skin: Negative for rash.  Neurological: Negative for dizziness, speech difficulty, weakness,  numbness and headaches.      Allergies  Review of patient's allergies indicates no known allergies.  Home Medications   Prior to Admission medications   Medication Sig Start Date End Date Taking? Authorizing Provider  albuterol (PROVENTIL HFA;VENTOLIN HFA) 108 (90 BASE) MCG/ACT inhaler Inhale 1-2 puffs into the lungs every 6 (six) hours as needed for wheezing or shortness of breath.   Yes Historical Provider, MD  Ibuprofen-Diphenhydramine Cit (ADVIL PM PO) Take 1 tablet by mouth at bedtime as needed (sleep).   Yes Historical Provider, MD  ibuprofen (ADVIL,MOTRIN) 600 MG tablet Take 1 tablet (600 mg total) by mouth every 6 (six) hours as needed. 07/21/15   Rolan Bucco, MD  omeprazole (PRILOSEC) 20 MG capsule Take 2 capsules (40 mg total) by mouth daily. Patient not taking: Reported on 07/21/2015 07/18/12   Linwood Dibbles, MD  oxyCODONE-acetaminophen (PERCOCET) 5-325 MG tablet Take 1-2 tablets by mouth every 4 (four) hours as needed. 07/21/15   Rolan Bucco, MD  tamsulosin (FLOMAX) 0.4 MG CAPS capsule Take 1 capsule (0.4 mg total) by mouth daily. 07/21/15   Rolan Bucco, MD   BP 114/84 mmHg  Pulse 84  Temp(Src) 98.1 F (36.7 C) (Oral)  Resp 16  Ht  (1.702 m)  Wt 120 lb (54.432 kg)  BMI 18.79 kg/m2  SpO2 100%  LMP 07/05/2015 Physical Exam  Constitutional: She is oriented to person, place, and time. She appears well-developed and well-nourished.  HENT:  Head: Normocephalic and atraumatic.  Eyes: Pupils are equal, round, and reactive to light.  Neck: Normal range of motion. Neck supple.  Cardiovascular:  Normal rate, regular rhythm and normal heart sounds.   Pulmonary/Chest: Effort normal and breath sounds normal. No respiratory distress. She has no wheezes. She has no rales. She exhibits no tenderness.  Abdominal: Soft. Bowel sounds are normal. There is no tenderness. There is no rebound and no guarding.  Musculoskeletal: Normal range of motion. She exhibits no edema.  Positive  tenderness to the right mid back. No spinal tenderness  Lymphadenopathy:    She has no cervical adenopathy.  Neurological: She is alert and oriented to person, place, and time.  Skin: Skin is warm and dry. No rash noted.  Psychiatric: She has a normal mood and affect.    ED Course  Procedures (including critical care time) Labs Review Labs Reviewed  URINALYSIS, ROUTINE W REFLEX MICROSCOPIC (NOT [facility]) - Abnormal; Notable for the following:    APPearance CLOUDY (*)    Hgb urine dipstick MODERATE (*)    Ketones, ur 40 (*)    Protein, ur 30 (*)    Leukocytes, UA MODERATE (*)    All other components within normal limits  CBC WITH DIFFERENTIAL/PLATELET - Abnormal; Notable for the following:    RBC 3.78 (*)    Hemoglobin 11.9 (*)    HCT 34.6 (*)    Neutro Abs 8.2 (*)    Monocytes Absolute 1.3 (*)    All other components within normal limits  COMPREHENSIVE METABOLIC PANEL - Abnormal; Notable for the following:    Potassium 3.3 (*)    Glucose, Bld 115 (*)    Calcium 8.8 (*)    Albumin 3.4 (*)    AST 70 (*)    ALT 74 (*)    All other components within normal limits  URINE MICROSCOPIC-ADD ON - Abnormal; Notable for the following:    Squamous Epithelial / LPF FEW (*)    Bacteria, UA MANY (*)    All other components within normal limits  PREGNANCY, URINE    Imaging Review Ct Renal Stone Study  07/21/2015  CLINICAL DATA:  Right flank pain for 3 days EXAM: CT ABDOMEN AND PELVIS WITHOUT CONTRAST TECHNIQUE: Multidetector CT imaging of the abdomen and pelvis was performed following the standard protocol without oral or intravenous contrast material administration. COMPARISON:  None. FINDINGS: Lower chest: There is minimal right base atelectasis anteriorly. Lung bases are otherwise clear. Hepatobiliary: No focal liver lesions are identified on this noncontrast enhanced study. Gallbladder wall is not appreciably thickened. There is no biliary duct dilatation. Pancreas: No pancreatic mass or  inflammatory focus. Spleen: No splenic lesions are identified. Adrenals/Urinary Tract: Adrenals appear normal bilaterally. 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 There is no renal mass on either side. There is slight hydronephrosis the right. There is no hydronephrosis on the left. There is a 2 mm calculus in the distal right ureter near the ureterovesical junction. No other ureteral calculi are identified. Urinary bladder is midline with wall thickness within normal limits. Stomach/Bowel: There is no bowel wall or mesenteric thickening. No bowel obstruction. No free air or portal venous air. Vascular/Lymphatic: No abdominal aortic aneurysm. No vascular lesions are identified on this noncontrast enhanced study. There is no demonstrable adenopathy in the abdomen or pelvis. Reproductive: Uterus is retroverted. There is a probable dominant follicle arising from right ovary measuring 2.5 x 1.4 cm. No other pelvic mass. There is a small amount of free fluid in the pelvis. Other: Appendix appears normal. No abscess is noted in the abdomen or pelvis. Musculoskeletal: There are no blastic or lytic bone lesions.  No intramuscular or abdominal wall lesion. IMPRESSION: 2 mm calculus distal right ureter with slight hydronephrosis on the right. 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 Probable dominant follicle right ovary. Small amount of fluid in the pelvis may be upper physiologic or due to recent ovarian cyst leakage or rupture. No bowel obstruction. Appendix appears normal. No demonstrable abscess. Electronically Signed   By: Bretta Bang III M.D.   On: 07/21/2015 11:31   I have personally reviewed and evaluated these images and lab results as part of my medical decision-making.   EKG Interpretation None      MDM   Final diagnoses:  Kidney stone    Patient presents with right flank pain. She does have a 2 mm stone in the distal right ureter. She does have some evidence of infection on her UA. Given this I  will start her on antibiotics. She is nontoxic appearing. She is not febrile. She has a normal creatinine. She was discharged home in good condition. She was given a prescription for ibuprofen, Percocet, and Cipro. She was encouraged to have close follow-up with [facility]. Return precautions were given. She was 5 to return if she has any worsening pain, vomiting or fevers. She has had no vomiting in the ED. She also is advised that her LFTs are mildly elevated and need to be followed up by PCP.    Rolan Bucco, MD 07/21/15 1152  Rolan Bucco, MD 07/21/15 1154

## 2015-07-21 NOTE — Discharge Instructions (Signed)
Kidney Stones °Kidney stones (urolithiasis) are deposits that form inside your kidneys. The intense pain is caused by the stone moving through the urinary tract. When the stone moves, the ureter goes into spasm around the stone. The stone is usually passed in the urine.  °CAUSES  °· A disorder that makes certain neck glands produce too much parathyroid hormone (primary hyperparathyroidism). °· A buildup of uric acid crystals, similar to gout in your joints. °· Narrowing (stricture) of the ureter. °· A kidney obstruction present at birth (congenital obstruction). °· Previous surgery on the kidney or ureters. °· Numerous kidney infections. °SYMPTOMS  °· Feeling sick to your stomach (nauseous). °· Throwing up (vomiting). °· Blood in the urine (hematuria). °· Pain that usually spreads (radiates) to the groin. °· Frequency or urgency of urination. °DIAGNOSIS  °· Taking a history and physical exam. °· Blood or urine tests. °· CT scan. °· Occasionally, an examination of the inside of the urinary bladder (cystoscopy) is performed. °TREATMENT  °· Observation. °· Increasing your fluid intake. °· Extracorporeal shock wave lithotripsy--This is a noninvasive procedure that uses shock waves to break up kidney stones. °· Surgery may be needed if you have severe pain or persistent obstruction. There are various surgical procedures. Most of the procedures are performed with the use of small instruments. Only small incisions are needed to accommodate these instruments, so recovery time is minimized. °The size, location, and chemical composition are all important variables that will determine the proper choice of action for you. Talk to your health care provider to better understand your situation so that you will minimize the risk of injury to yourself and your kidney.  °HOME CARE INSTRUCTIONS  °· Drink enough water and fluids to keep your urine clear or pale yellow. This will help you to pass the stone or stone fragments. °· Strain  all urine through the provided strainer. Keep all particulate matter and stones for your health care provider to see. The stone causing the pain may be as small as a grain of salt. It is very important to use the strainer each and every time you pass your urine. The collection of your stone will allow your health care provider to analyze it and verify that a stone has actually passed. The stone analysis will often identify what you can do to reduce the incidence of recurrences. °· Only take over-the-counter or prescription medicines for pain, discomfort, or fever as directed by your health care provider. °· Keep all follow-up visits as told by your health care provider. This is important. °· Get follow-up X-rays if required. The absence of pain does not always mean that the stone has passed. It may have only stopped moving. If the urine remains completely obstructed, it can cause loss of kidney function or even complete destruction of the kidney. It is your responsibility to make sure X-rays and follow-ups are completed. Ultrasounds of the kidney can show blockages and the status of the kidney. Ultrasounds are not associated with any radiation and can be performed easily in a matter of minutes. °· Make changes to your daily diet as told by your health care provider. You may be told to: °¨ Limit the amount of salt that you eat. °¨ Eat 5 or more servings of fruits and vegetables each day. °¨ Limit the amount of meat, poultry, fish, and eggs that you eat. °· Collect a 24-hour urine sample as told by your health care provider. You may need to collect another urine sample every 6-12   months. °SEEK MEDICAL CARE IF: °· You experience pain that is progressive and unresponsive to any pain medicine you have been prescribed. °SEEK IMMEDIATE MEDICAL CARE IF:  °· Pain cannot be controlled with the prescribed medicine. °· You have a fever or shaking chills. °· The severity or intensity of pain increases over 18 hours and is not  relieved by pain medicine. °· You develop a new onset of abdominal pain. °· You feel faint or pass out. °· You are unable to urinate. °  °This information is not intended to replace advice given to you by your health care provider. Make sure you discuss any questions you have with your health care provider. °  °Document Released: 08/29/2005 Document Revised: 05/20/2015 Document Reviewed: 01/30/2013 °Elsevier Interactive Patient Education ©2016 [employer] Inc[employer] ° °

## 2015-11-13 ENCOUNTER — Encounter (HOSPITAL_COMMUNITY): Payer: Self-pay | Admitting: Emergency Medicine

## 2015-11-13 ENCOUNTER — Emergency Department (HOSPITAL_COMMUNITY)
Admission: EM | Admit: 2015-11-13 | Discharge: 2015-11-13 | Disposition: A | Discharge status: Home / Self Care | Payer: No Typology Code available for payment source | Attending: Emergency Medicine | Admitting: Emergency Medicine

## 2015-11-13 DIAGNOSIS — Z792 Long term (current) use of antibiotics: Secondary | ICD-10-CM | POA: Insufficient documentation

## 2015-11-13 DIAGNOSIS — K0889 Other specified disorders of teeth and supporting structures: Secondary | ICD-10-CM

## 2015-11-13 DIAGNOSIS — F17200 Nicotine dependence, unspecified, uncomplicated: Secondary | ICD-10-CM | POA: Insufficient documentation

## 2015-11-13 MED ORDER — TRAMADOL HCL 50 MG PO TABS: 50.0000 mg | ORAL_TABLET | Freq: Four times a day (QID) | ORAL | Status: DC | PRN

## 2015-11-13 MED ORDER — AMOXICILLIN-POT CLAVULANATE 875-125 MG PO TABS: 1.0000 | ORAL_TABLET | Freq: Two times a day (BID) | ORAL | Status: DC

## 2015-11-13 NOTE — ED Provider Notes (Signed)
CSN: [id number]     Arrival date & time 11/13/15  1105 History  By signing my name below, I, Lyndel SafeKaitlyn Shelton, attest that this documentation has been prepared under the direction and in the presence of Delta Air LinesJeffery Brianne Maina, PA-C.  Electronically Signed: Lyndel SafeKaitlyn Shelton, ED Scribe. 11/13/2015. 12:15 PM.    Chief Complaint  Patient presents with  . Dental Pain   The history is provided by the patient. No language interpreter was used.   HPI Comments: Destiny Schneider is a 35 y.o. female, with a h/o poor dentition and multiple dental extractions, who presents to the Emergency Department complaining of gradually worsening, constant, moderate left upper dental pain X 3 days with onset of worsening left-sided facial swelling 1 day ago. She [place] in 12 days. Her dental pain is worse at night. She has been taking Advil without significant relief. Denies fevers, chills, neck pain, nausea, and vomiting. Pt is tolerating secretions. No trismus. No difficulty breathing.    History reviewed. No pertinent past medical history. History reviewed. No pertinent past surgical history. History reviewed. No pertinent family history. Social History  Substance Use Topics  . Smoking status: Current Every Day Smoker -- 0.50 packs/day  . Smokeless tobacco: None  . Alcohol Use: 0.6 oz/week    1 Shots of liquor per week   OB History    No data available     Review of Systems  Constitutional: Negative for fever and chills.  HENT: Positive for dental problem and facial swelling. Negative for trouble swallowing.   Respiratory: Negative for shortness of breath.   Gastrointestinal: Negative for nausea and vomiting.  Musculoskeletal: Negative for neck pain.  All other systems reviewed and are negative.  Allergies  Review of patient's allergies indicates no known allergies.  Home Medications   Prior to Admission medications   Medication Sig Start Date End Date Taking?  Authorizing Provider  albuterol (PROVENTIL HFA;VENTOLIN HFA) 108 (90 BASE) MCG/ACT inhaler Inhale 1-2 puffs into the lungs every 6 (six) hours as needed for wheezing or shortness of breath.    Historical Provider, MD  amoxicillin-clavulanate (AUGMENTIN) 875-125 MG tablet Take 1 tablet by mouth every 12 (twelve) hours. 11/13/15   Eyvonne MechanicJeffrey Eain Mullendore, PA-C  ciprofloxacin (CIPRO) 500 MG tablet Take 1 tablet (500 mg total) by mouth 2 (two) times daily. One po bid x 7 days 07/21/15   Rolan BuccoMelanie Belfi, MD  ibuprofen (ADVIL,MOTRIN) 600 MG tablet Take 1 tablet (600 mg total) by mouth every 6 (six) hours as needed. 07/21/15   Rolan BuccoMelanie Belfi, MD  Ibuprofen-Diphenhydramine Cit (ADVIL PM PO) Take 1 tablet by mouth at bedtime as needed (sleep).    Historical Provider, MD  omeprazole (PRILOSEC) 20 MG capsule Take 2 capsules (40 mg total) by mouth daily. Patient not taking: Reported on 07/21/2015 07/18/12   Linwood DibblesJon Knapp, MD  oxyCODONE-acetaminophen (PERCOCET) 5-325 MG tablet Take 1-2 tablets by mouth every 4 (four) hours as needed. 07/21/15   Rolan BuccoMelanie Belfi, MD  traMADol (ULTRAM) 50 MG tablet Take 1 tablet (50 mg total) by mouth every 6 (six) hours as needed. 11/13/15   Jontavius Rabalais, PA-C   BP 123/77 mmHg  Pulse 85  Temp(Src) 97.6 F (36.4 C) (Oral)  Resp 18  SpO2 100% Physical Exam  Constitutional: She is oriented to person, place, and time. She appears well-developed and well-nourished. No distress.  HENT:  Head: Normocephalic.  Mouth/Throat: Uvula is midline, oropharynx is clear and moist and mucous membranes are  normal. No oropharyngeal exudate, posterior oropharyngeal edema, posterior oropharyngeal erythema or tonsillar abscesses.  External exam shows asymmetry with minor swelling over left upper jaw, no redness or obvious signs of infection. Full active range of motion of the jaw. Neck is supple with full active range of motion, no tenderness to palpation of the soft tissues.   Gumline palpated no obvious signs of  infection including warmth, redness, abscess, tenderness. Posterior oropharynx clear with no signs of infection, uvula is midline and rises with phonation, tonsils present and normal in size, symmetrical bilateral, tongue is normal soft touch with full active range of motion, floor mouth is soft nontender.  Eyes: Conjunctivae are normal. Pupils are equal, round, and reactive to light. Right eye exhibits no discharge. Left eye exhibits no discharge.  Neck: Normal range of motion. Neck supple. No JVD present. No tracheal deviation present. No thyromegaly present.  Pulmonary/Chest: No stridor.  Lymphadenopathy:    She has no cervical adenopathy.  Neurological: She is alert and oriented to person, place, and time.  Skin: Skin is warm and dry. No rash noted. She is not diaphoretic. No erythema. No pallor.  Psychiatric: She has a normal mood and affect. Her behavior is normal. Judgment and thought content normal.  Nursing note and vitals reviewed.   ED Course  Procedures  DIAGNOSTIC STUDIES: Oxygen Saturation is 100% on RA, normal by my interpretation.    COORDINATION OF CARE: 12:12 PM Discussed treatment plan with pt at bedside and pt agreed to plan.   MDM   Final diagnoses:  Pain, dental   Labs:  Imaging:  Consults:  Therapeutics:  Discharge Meds: Augmentin, Tramadol   Assessment/Plan: Will give dental resource guide and advised pt to seek evaluation by a dentist sooner than her scheduled appointment in 12 days. Antibiotic course prescribed.   Patient with dentalgia. Patient has a very minor amount of swelling to the upper jaw, no signs of abscess cellulitis or significant deep space infection.   Exam not concerning for Ludwig's angina or pharyngeal abscess.  Will treat with Augmentin and a short course of Tramadol. Pt instructed to follow-up with dentist.  Discussed strict return precautions including fevers, red streaking and other signs of infection. Pt safe for discharge.  I  personally performed the services described in this documentation, which was scribed in my presence. The recorded information has been reviewed and is accurate.    Eyvonne Mechanic, PA-C 11/13/15 1330  Tilden Fossa, MD 11/14/15 [phone]

## 2015-11-13 NOTE — ED Notes (Signed)
Pt sts left sided dental pain with swelling x 3 days  

## 2015-11-13 NOTE — Discharge Instructions (Signed)
Community Resource Guide Dental °The [employer]’s “211” is a great source of information about community services available.  Access by dialing 2-1-1 from anywhere in [place], or by website -  www.nc211.org.  ° °Other Local Resources (Updated 09/2015) ° °Dental  Care °  °Services ° °  °Phone Number and Address  °Cost  °[facility] For children 0 - 36 years of age:  °• Cleaning °• Tooth brushing/flossing instruction °• Sealants, fillings, crowns °• Extractions °• Emergency treatment  [phone] °[address] °[place], [place] [address] Charges based on family income.  Medicaid and some insurance plans accepted.   °  °[facility] • Cleaning °• Sealants, fillings, crowns °• Extractions °• Emergency treatment [phone] °[address] °[place], [place] ° Pregnant women 18 years of age or older with a Medicaid card  °[facility] [place] • Cleaning °• Sealants, fillings, crowns °• Extractions °• Emergency treatment [phone] °[address] °[place], [place] Pregnant women 18 years of age or older with a Medicaid card  °[facility] For children 0 - 36 years of age:  °• Cleaning °• Tooth brushing/flossing instruction °• Sealants, fillings, crowns °• Extractions °• Emergency treatment °Limited orthodontic services for patients with Medicaid [phone] °[address] °[place], [place] [address] Medicaid and [place] Health Choice cover for children up to age 36 and pregnant women.  Parents of children up to age 36 without Medicaid pay a reduced fee at time of service.  °[facility] [place] For children 0 - 36 years of age:  °• Cleaning °• Tooth brushing/flossing instruction °• Sealants, fillings, crowns °• Extractions °• Emergency treatment °Limited orthodontic services for patients with Medicaid  [phone] °[address] °[place], [place].  Medicaid and [place] Health Choice cover for children up to age 36 and pregnant women.  Parents of children up to age 36 without Medicaid pay a reduced fee.  °[facility] • Cleaning °• Sealants, fillings, crowns °• Extractions ° °Hours: Tuesdays and Thursdays, 4:15 - 8 pm [phone] °[address] °[place], [place] [address] Services free of charge to [place] residents ages 18-64 who do not have health insurance, Medicare, Medicaid, or VA benefits and fall within federal poverty guidelines  °[facility] ° ° ° Provides dental care in addition to primary medical care, nutritional counseling, and pharmacy: °• Cleaning °• Sealants, fillings, crowns °• Extractions ° ° ° ° ° ° ° ° ° ° ° ° ° ° ° ° ° [phone] °[facility], [address] °[place], [place] ° °[phone] °[facility], [address] [place], [place] ° °[phone] °[facility] °[place], [place] ° °[phone] °[facility], [address] °[place], [place] ° °[phone] °[facility] °[address] °[place], [place] Accepts Medicaid, Medicare, most insurance.  Also provides services available to all with fees adjusted based on ability to pay.    °[facility] • Cleaning °• Tooth brushing/flossing instruction °• Sealants, fillings, crowns °• Extractions °• Emergency treatment °Hours: Tuesdays, Thursdays, and Fridays from 8 am to 5 pm by appointment only. [phone] °[address] °[place], [place] [address] [place] residents with Medicaid (depending on eligibility) and children with  Health Choice - call for more information.  °  [facility] • Extractions only ° °Hours: 2nd and 4th Thursday of each month from 6:30 am - 9 am.   [phone]. [phone] °[address] °[place], [place] [address] Ages 18 and older only.  Patients are seen on a first come, first served basis.  °[facility] • Cleanings °• Fillings °• Extractions °• Orthodontics °• Endodontics °• Implants/Crowns/Bridges °• Complete and partial dentures [phone] °[place], [place] Patients must complete an application for services.  There is often a waiting list.   ° °

## 2016-09-01 ENCOUNTER — Encounter (HOSPITAL_COMMUNITY): Payer: Self-pay | Admitting: Emergency Medicine

## 2016-09-01 ENCOUNTER — Emergency Department (HOSPITAL_COMMUNITY)
Admission: EM | Admit: 2016-09-01 | Discharge: 2016-09-01 | Disposition: A | Discharge status: Home / Self Care | Payer: Medicaid Other | Attending: Emergency Medicine | Admitting: Emergency Medicine

## 2016-09-01 DIAGNOSIS — F17200 Nicotine dependence, unspecified, uncomplicated: Secondary | ICD-10-CM | POA: Diagnosis not present

## 2016-09-01 DIAGNOSIS — Z3201 Encounter for pregnancy test, result positive: Secondary | ICD-10-CM | POA: Diagnosis not present

## 2016-09-01 DIAGNOSIS — Z79899 Other long term (current) drug therapy: Secondary | ICD-10-CM | POA: Insufficient documentation

## 2016-09-01 DIAGNOSIS — N644 Mastodynia: Secondary | ICD-10-CM | POA: Diagnosis present

## 2016-09-01 LAB — POC URINE PREG, ED: Preg Test, Ur: POSITIVE — AB

## 2016-09-01 NOTE — ED Provider Notes (Signed)
[facility]-EMERGENCY DEPT Provider Note   CSN: [id number] Arrival date & time: 09/01/16  1622  By signing my name below, I, Placido SouLogan Joldersma, attest that this documentation has been prepared under the direction and in the presence of Loana Salvaggio C. Persephanie Laatsch, PA-C. Electronically Signed: Placido SouLogan Joldersma, ED Scribe. 09/01/16. 6:10 PM.   History   Chief Complaint Chief Complaint  Patient presents with  . breasts sore    HPI HPI Comments: Destiny Schneider is a 35 y.o. female who presents to the Emergency Department complaining of bilateral breast tenderness x 3 weeks. She reports associated n/v. Her last normal menstrual cycle was in October. Had minor spotting in November. Patient confirms she is sexually active. Denies abdominal pain, fever/chills, or any other complaints.     The history is provided by the patient and medical records. No language interpreter was used.    History reviewed. No pertinent past medical history.  There are no active problems to display for this patient.   History reviewed. No pertinent surgical history.  OB History    No data available       Home Medications    Prior to Admission medications   Medication Sig Start Date End Date Taking? Authorizing Provider  albuterol (PROVENTIL HFA;VENTOLIN HFA) 108 (90 BASE) MCG/ACT inhaler Inhale 1-2 puffs into the lungs every 6 (six) hours as needed for wheezing or shortness of breath.    Historical Provider, MD  amoxicillin-clavulanate (AUGMENTIN) 875-125 MG tablet Take 1 tablet by mouth every 12 (twelve) hours. 11/13/15   Eyvonne MechanicJeffrey Hedges, PA-C  ciprofloxacin (CIPRO) 500 MG tablet Take 1 tablet (500 mg total) by mouth 2 (two) times daily. One po bid x 7 days 07/21/15   Rolan BuccoMelanie Belfi, MD  ibuprofen (ADVIL,MOTRIN) 600 MG tablet Take 1 tablet (600 mg total) by mouth every 6 (six) hours as needed. 07/21/15   Rolan BuccoMelanie Belfi, MD  Ibuprofen-Diphenhydramine Cit (ADVIL PM PO) Take 1 tablet by mouth at bedtime as needed (sleep).     Historical Provider, MD  omeprazole (PRILOSEC) 20 MG capsule Take 2 capsules (40 mg total) by mouth daily. Patient not taking: Reported on 07/21/2015 07/18/12   Linwood DibblesJon Knapp, MD  oxyCODONE-acetaminophen (PERCOCET) 5-325 MG tablet Take 1-2 tablets by mouth every 4 (four) hours as needed. 07/21/15   Rolan BuccoMelanie Belfi, MD  traMADol (ULTRAM) 50 MG tablet Take 1 tablet (50 mg total) by mouth every 6 (six) hours as needed. 11/13/15   Eyvonne MechanicJeffrey Hedges, PA-C    Family History No family history on file.  Social History Social History  Substance Use Topics  . Smoking status: Current Every Day Smoker    Packs/day: 0.50  . Smokeless tobacco: Not on file  . Alcohol use 0.6 oz/week    1 Shots of liquor per week     Allergies   Patient has no known allergies.   Review of Systems Review of Systems  Constitutional: Negative for chills and fever.  Gastrointestinal: Positive for nausea and vomiting. Negative for abdominal pain.  Genitourinary:       Breast tenderness   Physical Exam Updated Vital Signs BP 119/81 (BP Location: Left Arm)   Pulse 87   Temp 98.2 F (36.8 C) (Oral)   Resp 18   LMP 08/02/2016   SpO2 100%   Physical Exam  Constitutional: She appears well-developed and well-nourished. No distress.  HENT:  Head: Normocephalic and atraumatic.  Eyes: Conjunctivae are normal.  Neck: Neck supple.  Cardiovascular: Normal rate and regular rhythm.   Pulmonary/Chest: Effort  normal.  Neurological: She is alert.  Skin: Skin is warm and dry. She is not diaphoretic.  Psychiatric: She has a normal mood and affect. Her behavior is normal.  Nursing note and vitals reviewed.  ED Treatments / Results  Labs (all labs ordered are listed, but only abnormal results are displayed) Labs Reviewed  POC URINE PREG, ED - Abnormal; Notable for the following:       Result Value   Preg Test, Ur POSITIVE (*)    All other components within normal limits    EKG  EKG Interpretation None        Radiology No results found.  Procedures Procedures  DIAGNOSTIC STUDIES: Oxygen Saturation is 100% on RA, normal by my interpretation.    COORDINATION OF CARE: 6:10 PM Discussed next steps with pt. Pt verbalized understanding and is agreeable with the plan.    Medications Ordered in ED Medications - No data to display   Initial Impression / Assessment and Plan / ED Course  I have reviewed the triage vital signs and the nursing notes.  Pertinent labs & imaging results that were available during my care of the patient were reviewed by me and considered in my medical decision making (see chart for details).  Clinical Course     Patient presents with breast tenderness and amenorrhea for the past 2 months. Positive pregnancy test today. No symptoms that would give suspicion for ectopic pregnancy. OB/GYN follow-up.  I personally performed the services described in this documentation, which was scribed in my presence. The recorded information has been reviewed and is accurate. Final Clinical Impressions(s) / ED Diagnoses   Final diagnoses:  Positive pregnancy test    New Prescriptions Discharge Medication List as of 09/01/2016  6:11 PM       Anselm PancoastShawn C Jenean Escandon, PA-C 09/02/16 1105    Pricilla LovelessScott Goldston, MD 09/03/16 2346

## 2016-09-01 NOTE — ED Triage Notes (Signed)
Per pt, states she started having breast soreness and vomiting on and off-states foods not tasting the same-states she never thought she could get pregnant again and doesn't know if she is going through early menopause

## 2016-09-01 NOTE — Discharge Instructions (Signed)
You had a positive pregnancy test today. Please follow up with OBGYN for further evaluation and chronic management.

## 2016-09-12 DIAGNOSIS — Z8619 Personal history of other infectious and parasitic diseases: Secondary | ICD-10-CM

## 2016-09-12 HISTORY — DX: Personal history of other infectious and parasitic diseases: Z86.19

## 2016-10-05 ENCOUNTER — Encounter (HOSPITAL_COMMUNITY): Payer: Self-pay | Admitting: *Deleted

## 2016-10-05 ENCOUNTER — Inpatient Hospital Stay (HOSPITAL_COMMUNITY)
Admission: AD | Admit: 2016-10-05 | Discharge: 2016-10-05 | Disposition: A | Discharge status: Home / Self Care | Payer: Medicaid Other | Source: Ambulatory Visit | Attending: Obstetrics & Gynecology | Admitting: Obstetrics & Gynecology

## 2016-10-05 DIAGNOSIS — O98311 Other infections with a predominantly sexual mode of transmission complicating pregnancy, first trimester: Secondary | ICD-10-CM | POA: Insufficient documentation

## 2016-10-05 DIAGNOSIS — O26899 Other specified pregnancy related conditions, unspecified trimester: Secondary | ICD-10-CM

## 2016-10-05 DIAGNOSIS — Z3A09 9 weeks gestation of pregnancy: Secondary | ICD-10-CM | POA: Insufficient documentation

## 2016-10-05 DIAGNOSIS — O99331 Smoking (tobacco) complicating pregnancy, first trimester: Secondary | ICD-10-CM | POA: Diagnosis not present

## 2016-10-05 DIAGNOSIS — A599 Trichomoniasis, unspecified: Secondary | ICD-10-CM | POA: Insufficient documentation

## 2016-10-05 DIAGNOSIS — Z811 Family history of alcohol abuse and dependence: Secondary | ICD-10-CM | POA: Diagnosis not present

## 2016-10-05 DIAGNOSIS — R1030 Lower abdominal pain, unspecified: Secondary | ICD-10-CM | POA: Diagnosis present

## 2016-10-05 DIAGNOSIS — Z79899 Other long term (current) drug therapy: Secondary | ICD-10-CM | POA: Diagnosis not present

## 2016-10-05 DIAGNOSIS — R109 Unspecified abdominal pain: Secondary | ICD-10-CM

## 2016-10-05 DIAGNOSIS — Z8249 Family history of ischemic heart disease and other diseases of the circulatory system: Secondary | ICD-10-CM | POA: Diagnosis not present

## 2016-10-05 DIAGNOSIS — O26891 Other specified pregnancy related conditions, first trimester: Secondary | ICD-10-CM | POA: Insufficient documentation

## 2016-10-05 LAB — WET PREP, GENITAL
Clue Cells Wet Prep HPF POC: NONE SEEN
SPERM: NONE SEEN
YEAST WET PREP: NONE SEEN

## 2016-10-05 LAB — URINALYSIS, ROUTINE W REFLEX MICROSCOPIC
Bilirubin Urine: NEGATIVE
Glucose, UA: NEGATIVE mg/dL
Hgb urine dipstick: NEGATIVE
KETONES UR: 80 mg/dL — AB
LEUKOCYTES UA: NEGATIVE
Nitrite: NEGATIVE
PROTEIN: NEGATIVE mg/dL
Specific Gravity, Urine: 1.021 (ref 1.005–1.030)
pH: 5 (ref 5.0–8.0)

## 2016-10-05 MED ORDER — ACETAMINOPHEN 500 MG PO TABS
1000.0000 mg | ORAL_TABLET | Freq: Once | ORAL | Status: AC
  Administered 2016-10-05: 1000 mg via ORAL
  Filled 2016-10-05: qty 2

## 2016-10-05 MED ORDER — METRONIDAZOLE 500 MG PO TABS
2000.0000 mg | ORAL_TABLET | Freq: Once | ORAL | Status: AC
  Administered 2016-10-05: 2000 mg via ORAL
  Filled 2016-10-05: qty 4

## 2016-10-05 NOTE — MAU Note (Signed)
[facility] Stared having pelvic pressure and pain last night. Having frequent urination.

## 2016-10-05 NOTE — Progress Notes (Addendum)
G3P1 @ [redacted] wksga. Presents to triage for vaginal pain, frequent urination that started last night. Pain 6/10. Denies LOF or bleeding. VSS. See flow sheet for details  1944: provider at bs.   2110: Discharge instructions given with pt understanding. Pt left unit via ambulatory.

## 2016-10-05 NOTE — MAU Provider Note (Signed)
History     CSN: [id number]  Arrival date and time: 10/05/16 1656   First Provider Initiated Contact with Patient 10/05/16 1941      Chief Complaint  Patient presents with  . Abdominal Pain   HPI Destiny Schneider is a 36 y.o. [id number] at [redacted]w[redacted]d who presents to MAU today with complaint of lower abdominal pain since last night. She states that pain comes and goes. She has not taken anything for pain. She denies vaginal bleeding or discharge. She has had urinary frequency, but denies dysuria or fever.   OB History    Gravida Para Term Preterm AB Living   3 1   1 1 1    SAB TAB Ectopic Multiple Live Births           1      History reviewed. No pertinent past medical history.  History reviewed. No pertinent surgical history.  Family History  Problem Relation Age of Onset  . Varicose Veins Mother   . Alcohol abuse Father   . Drug abuse Father     Social History  Substance Use Topics  . Smoking status: Current Every Day Smoker    Packs/day: 0.50  . Smokeless tobacco: Former Neurosurgeon  . Alcohol use 0.6 oz/week    1 Shots of liquor per week    Allergies: No Known Allergies  Prescriptions Prior to Admission  Medication Sig Dispense Refill Last Dose  . Ibuprofen-Diphenhydramine Cit (ADVIL PM PO) Take 1 tablet by mouth at bedtime as needed (sleep).   Past Week at Unknown time  . albuterol (PROVENTIL HFA;VENTOLIN HFA) 108 (90 BASE) MCG/ACT inhaler Inhale 1-2 puffs into the lungs every 6 (six) hours as needed for wheezing or shortness of breath.   Not Taking at Unknown time  . amoxicillin-clavulanate (AUGMENTIN) 875-125 MG tablet Take 1 tablet by mouth every 12 (twelve) hours. (Patient not taking: Reported on 10/05/2016) 20 tablet 0 Not Taking at Unknown time  . ciprofloxacin (CIPRO) 500 MG tablet Take 1 tablet (500 mg total) by mouth 2 (two) times daily. One po bid x 7 days (Patient not taking: Reported on 10/05/2016) 14 tablet 0 Not Taking at Unknown time  . ibuprofen  (ADVIL,MOTRIN) 600 MG tablet Take 1 tablet (600 mg total) by mouth every 6 (six) hours as needed. (Patient not taking: Reported on 10/05/2016) 30 tablet 0 Not Taking at Unknown time  . omeprazole (PRILOSEC) 20 MG capsule Take 2 capsules (40 mg total) by mouth daily. (Patient not taking: Reported on 07/21/2015) 30 capsule 0 Not Taking at Unknown time  . oxyCODONE-acetaminophen (PERCOCET) 5-325 MG tablet Take 1-2 tablets by mouth every 4 (four) hours as needed. (Patient not taking: Reported on 10/05/2016) 15 tablet 0 Not Taking at Unknown time  . traMADol (ULTRAM) 50 MG tablet Take 1 tablet (50 mg total) by mouth every 6 (six) hours as needed. (Patient not taking: Reported on 10/05/2016) 10 tablet 0 Not Taking at Unknown time    Review of Systems  Constitutional: Negative for fever.  Gastrointestinal: Positive for abdominal pain and nausea. Negative for constipation, diarrhea and vomiting.  Genitourinary: Positive for frequency. Negative for dysuria, urgency, vaginal bleeding and vaginal discharge.   Physical Exam   Blood pressure 123/69, pulse 96, temperature 98.1 F (36.7 C), temperature source Oral, resp. rate 18, height 5' 6.5" (1.689 m), weight 111 lb 6.4 oz (50.5 kg), last menstrual period 08/03/2016.  Physical Exam  Nursing note and vitals reviewed. Constitutional: She is oriented to person,  place, and time. She appears well-developed and well-nourished. No distress.  HENT:  Head: Normocephalic and atraumatic.  Cardiovascular: Normal rate.   Respiratory: Effort normal.  GI: Soft. She exhibits no distension and no mass. There is tenderness (mild bilateral lower abdomen). There is no rebound and no guarding.  Genitourinary: Uterus is enlarged and tender (mild). Cervix exhibits no motion tenderness. No bleeding in the vagina. Vaginal discharge (small white) found.  Neurological: She is alert and oriented to person, place, and time.  Skin: Skin is warm and dry. No erythema.  Psychiatric: She  has a normal mood and affect.   Results for orders placed or performed during the hospital encounter of 10/05/16 (from the past 24 hour(s))  Urinalysis, Routine w reflex microscopic     Status: Abnormal   Collection Time: 10/05/16  5:20 PM  Result Value Ref Range   Color, Urine YELLOW YELLOW   APPearance CLEAR CLEAR   Specific Gravity, Urine 1.021 1.005 - 1.030   pH 5.0 5.0 - 8.0   Glucose, UA NEGATIVE NEGATIVE mg/dL   Hgb urine dipstick NEGATIVE NEGATIVE   Bilirubin Urine NEGATIVE NEGATIVE   Ketones, ur 80 (A) NEGATIVE mg/dL   Protein, ur NEGATIVE NEGATIVE mg/dL   Nitrite NEGATIVE NEGATIVE   Leukocytes, UA NEGATIVE NEGATIVE  Wet prep, genital     Status: Abnormal   Collection Time: 10/05/16  7:50 PM  Result Value Ref Range   Yeast Wet Prep HPF POC NONE SEEN NONE SEEN   Trich, Wet Prep PRESENT (A) NONE SEEN   Clue Cells Wet Prep HPF POC NONE SEEN NONE SEEN   WBC, Wet Prep HPF POC FEW (A) NONE SEEN   Sperm NONE SEEN      MAU Course  Procedures None   MDM + UPT last month in ED [facility]- 153 bpm with doppler today  UA, wet prep and GC/chlamydia obtained.  Tylenol given for pain.  2000 - lab results pending. Care turned over to [facility] Iiieather Hogan, CNM Marny LowensteinJulie N Wenzel, PA-C  10/05/2016, 8:04 PM   Treated with flagyl here in MAU  Assessment and Plan   1. Trichomoniasis   2. Abdominal pain affecting pregnancy, antepartum   3. [redacted] weeks gestation of pregnancy    DC home Comfort measures reviewed  1st Trimester precautions  RX: none, treated here in MAU  Return to MAU as needed FU with OB as planned  Follow-up Information    [facility]   Contact information: [address] [place] [id number] [phone]

## 2016-10-05 NOTE — Discharge Instructions (Signed)
Prenatal Care Providers °[facility]    [facility] ° Phone- [phone]     Phone: [phone] °         °[facility]                      [facility] ° @[place]     Phone: [phone] ° Phone: [phone] °        [facility] °[facility]     Phone: [phone] ° Phone: [phone]   °        [facility] °[facility] [place]                hone: [phone] ° Phone: [phone] °        [facility] °Dr. Bernard Marshall      Phone: [phone] ° Phone: [phone] °        [facility] °[facility]                Phone: [phone] ° Women’s Health  ° Phone:[phone]    [facility] °         Phone: [phone] °[facility] °  Phone: [phone] °Safe Medications in Pregnancy  ° °Acne: °Benzoyl Peroxide °Salicylic Acid ° °Backache/Headache: °Tylenol: 2 regular strength every 4 hours OR °             2 Extra strength every 6 hours ° °Colds/Coughs/Allergies: °Benadryl (alcohol free) 25 mg every 6 hours as needed °Breath right strips °Claritin °Cepacol throat lozenges °Chloraseptic throat spray °Cold-Eeze- up to three times per day °Cough drops, alcohol free °Flonase (by prescription only) °Guaifenesin °Mucinex °Robitussin DM (plain only, alcohol free) °Saline nasal spray/drops °Sudafed (pseudoephedrine) & Actifed ** use only after [redacted] weeks gestation and if you do not have high blood pressure °Tylenol °Vicks Vaporub °Zinc lozenges °Zyrtec  ° °Constipation: °Colace °Ducolax suppositories °Fleet enema °Glycerin suppositories °Metamucil °Milk of magnesia °Miralax °Senokot °Smooth move tea ° °Diarrhea: °Kaopectate °Imodium A-D ° °*NO pepto Bismol ° °Hemorrhoids: °Anusol °Anusol HC °Preparation H °Tucks ° °Indigestion: °Tums °Maalox °Mylanta °Zantac  °Pepcid ° °Insomnia: °Benadryl (alcohol free) 25mg every 6 hours as needed °Tylenol  PM °Unisom, no Gelcaps ° °Leg Cramps: °Tums °MagGel ° °Nausea/Vomiting:  °Bonine °Dramamine °Emetrol °Ginger extract °Sea bands °Meclizine  °Nausea medication to take during pregnancy:  °Unisom (doxylamine succinate 25 mg tablets) Take one tablet daily at bedtime. If symptoms are not adequately controlled, the dose can be increased to a maximum recommended dose of two tablets daily (1/2 tablet in the morning, 1/2 tablet mid-afternoon and one at bedtime). °Vitamin B6 100mg tablets. Take one tablet twice a day (up to 200 mg per day). ° °Skin Rashes: °Aveeno products °Benadryl cream or 25mg every 6 hours as needed °Calamine Lotion °1% cortisone cream ° °Yeast infection: °Gyne-lotrimin 7 °Monistat 7 ° ° °**If taking multiple medications, please check labels to avoid duplicating the same active ingredients °**take medication as directed on the label °** Do not exceed 4000 mg of tylenol in 24 hours °**Do not take medications that contain aspirin or ibuprofen ° ° ° ° °

## 2016-10-06 LAB — GC/CHLAMYDIA PROBE AMP (~~LOC~~) NOT AT ARMC
CHLAMYDIA, DNA PROBE: NEGATIVE
NEISSERIA GONORRHEA: NEGATIVE

## 2016-10-10 IMAGING — CT CT RENAL STONE PROTOCOL
1 of 4 series · 5 of 46 positions shown, 6 images · non-contrast
Comparison: None.

CLINICAL DATA: Right flank pain for 3 days

EXAM:
CT ABDOMEN AND PELVIS WITHOUT CONTRAST
TECHNIQUE: Multidetector CT imaging of the abdomen and pelvis was performed
following the standard protocol without oral or intravenous contrast
material administration.

[Series 206: coronal 2 · coronal · 0.45mm/px · 5 of 76 slices shown, 6 images]
[im 9/76  soft-tissue]
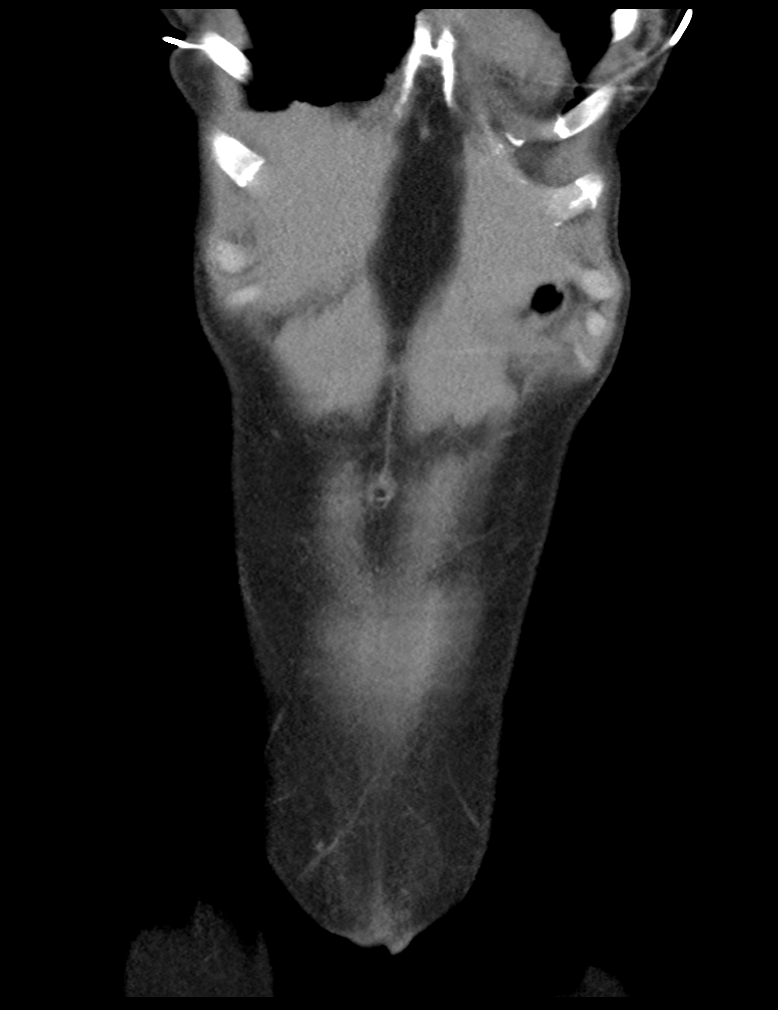
[im 9/76  bone]
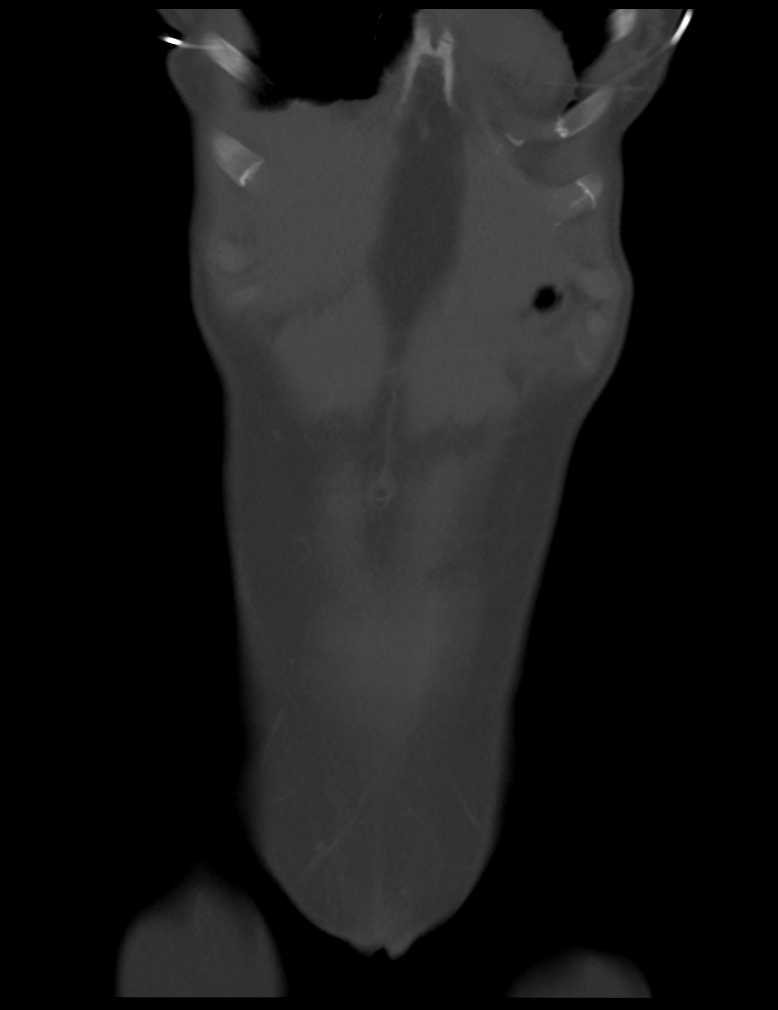
[im 26/76  soft-tissue]
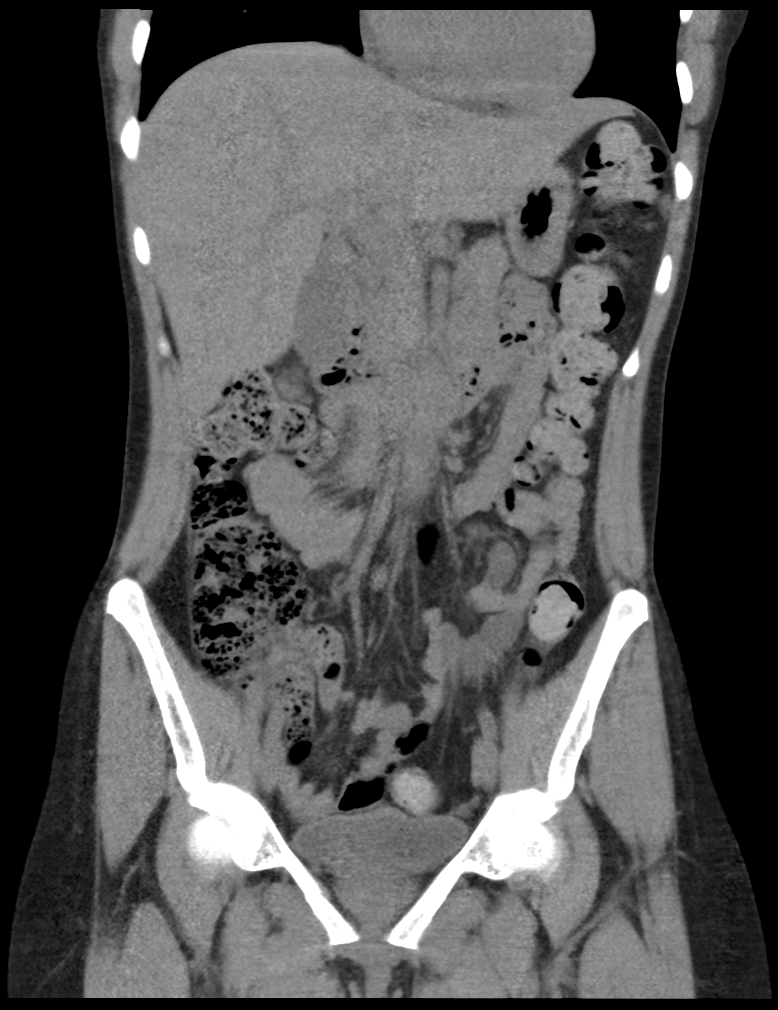
[im 42/76  soft-tissue]
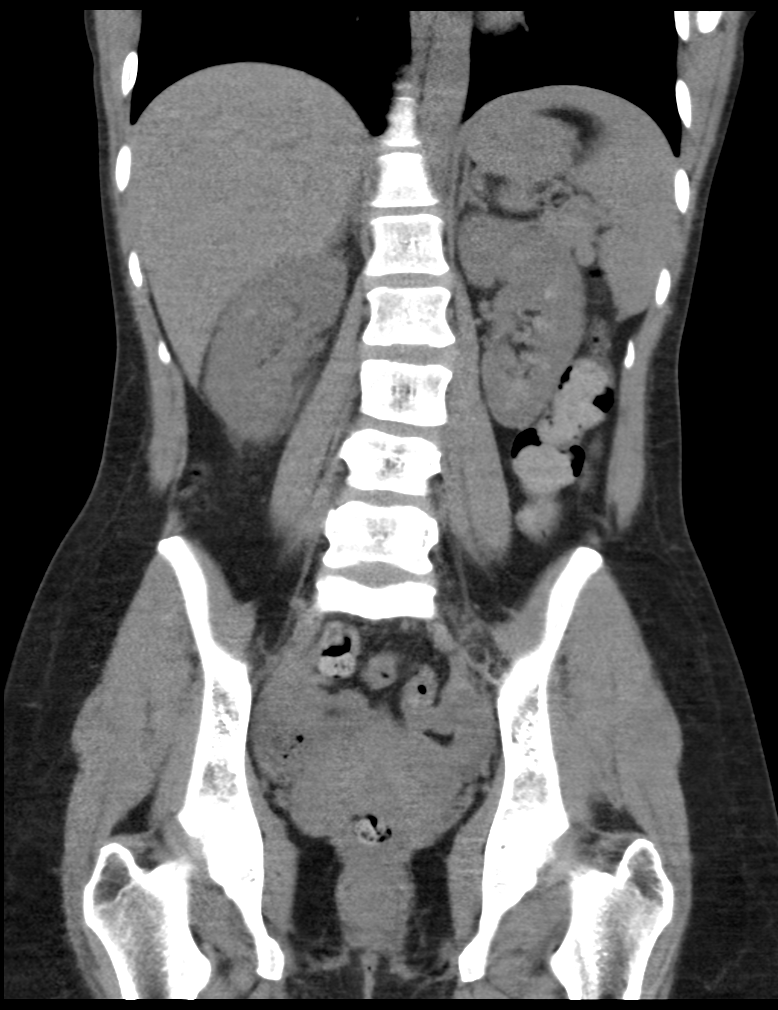
[im 51/76  soft-tissue]
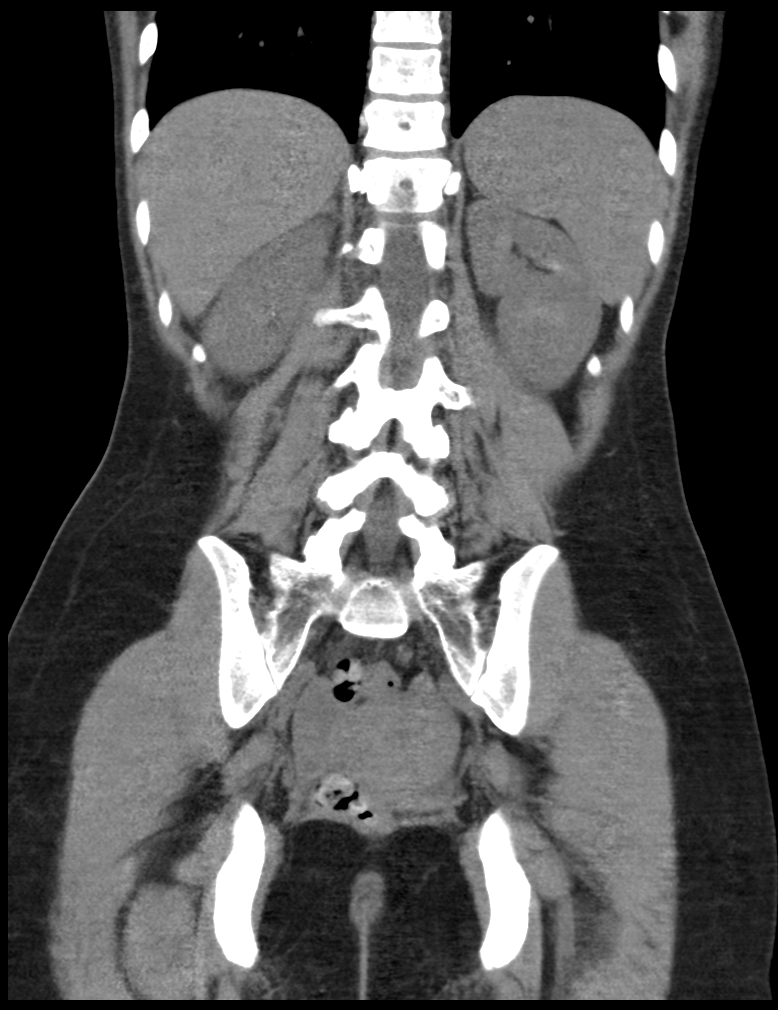
[im 67/76  soft-tissue]
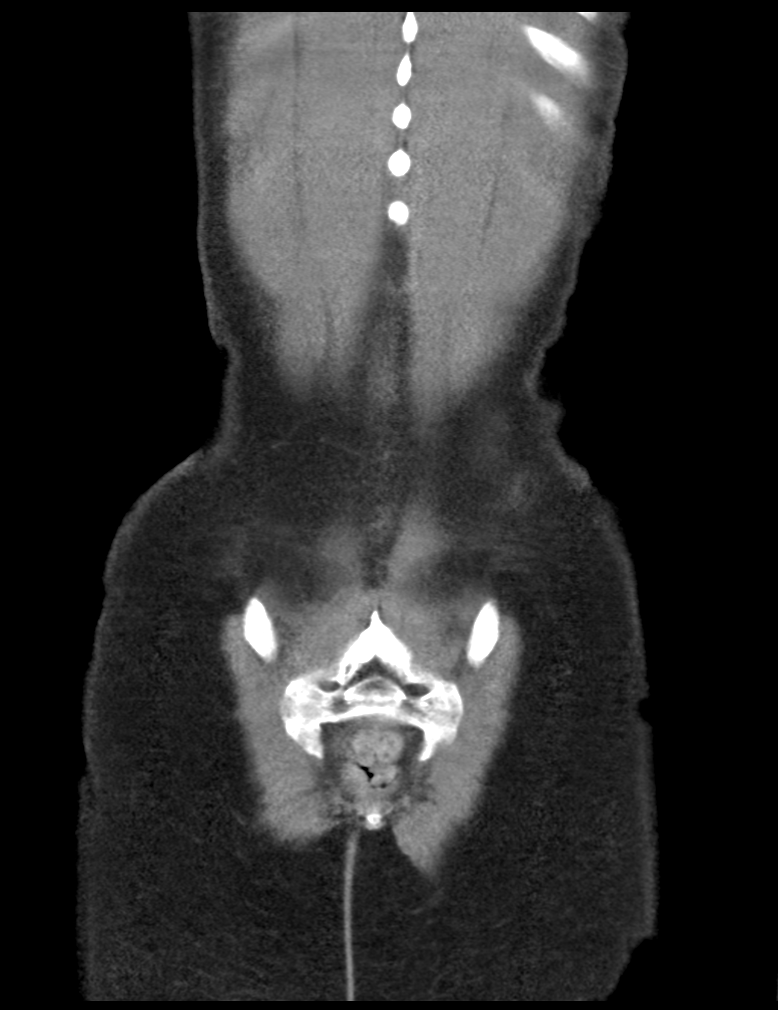

[5 of 46 positions shown; findings below may reference images not displayed]

FINDINGS: Lower chest: There is minimal right base atelectasis anteriorly.
Lung bases are otherwise clear.

Hepatobiliary: No focal liver lesions are identified on this
noncontrast enhanced study. Gallbladder wall is not appreciably
thickened. There is no biliary duct dilatation.

Pancreas: No pancreatic mass or inflammatory focus.

Spleen: No splenic lesions are identified.

Adrenals/Urinary Tract: Adrenals appear normal bilaterally. There is
nephrocalcinosis bilaterally. There is no renal mass on either side.
There is slight hydronephrosis the right. There is no hydronephrosis
on the left. There is a 2 mm calculus in the distal right ureter
near the ureterovesical junction. No other ureteral calculi are
identified. Urinary bladder is midline with wall thickness within
normal limits.

Stomach/Bowel: There is no bowel wall or mesenteric thickening. No
bowel obstruction. No free air or portal venous air.

Vascular/Lymphatic: No abdominal aortic aneurysm. No vascular
lesions are identified on this noncontrast enhanced study. There is
no demonstrable adenopathy in the abdomen or pelvis.

Reproductive: Uterus is retroverted. There is a probable dominant
follicle arising from right ovary measuring 2.5 x 1.4 cm. No other
pelvic mass. There is a small amount of free fluid in the pelvis.

Other: Appendix appears normal. No abscess is noted in the abdomen
or pelvis.

Musculoskeletal: There are no blastic or lytic bone lesions. No
intramuscular or abdominal wall lesion.
IMPRESSION: 2 mm calculus distal right ureter with slight hydronephrosis on the
right. There is bilateral nephrocalcinosis.

Probable dominant follicle right ovary. Small amount of fluid in the
pelvis may be upper physiologic or due to recent ovarian cyst
leakage or rupture.

No bowel obstruction. Appendix appears normal. No demonstrable
abscess.

## 2016-11-25 ENCOUNTER — Inpatient Hospital Stay (HOSPITAL_COMMUNITY): Payer: Medicaid Other

## 2016-11-25 ENCOUNTER — Inpatient Hospital Stay (HOSPITAL_COMMUNITY)
Admission: AD | Admit: 2016-11-25 | Discharge: 2016-11-25 | Disposition: A | Discharge status: Home / Self Care | Payer: Medicaid Other | Source: Ambulatory Visit | Attending: Family Medicine | Admitting: Family Medicine

## 2016-11-25 ENCOUNTER — Encounter (HOSPITAL_COMMUNITY): Payer: Self-pay

## 2016-11-25 DIAGNOSIS — R109 Unspecified abdominal pain: Secondary | ICD-10-CM

## 2016-11-25 DIAGNOSIS — O26899 Other specified pregnancy related conditions, unspecified trimester: Secondary | ICD-10-CM

## 2016-11-25 DIAGNOSIS — O09212 Supervision of pregnancy with history of pre-term labor, second trimester: Secondary | ICD-10-CM | POA: Insufficient documentation

## 2016-11-25 DIAGNOSIS — R1030 Lower abdominal pain, unspecified: Secondary | ICD-10-CM | POA: Insufficient documentation

## 2016-11-25 DIAGNOSIS — Z8751 Personal history of pre-term labor: Secondary | ICD-10-CM

## 2016-11-25 DIAGNOSIS — O09522 Supervision of elderly multigravida, second trimester: Secondary | ICD-10-CM | POA: Insufficient documentation

## 2016-11-25 DIAGNOSIS — O9989 Other specified diseases and conditions complicating pregnancy, childbirth and the puerperium: Secondary | ICD-10-CM | POA: Diagnosis not present

## 2016-11-25 DIAGNOSIS — Z79899 Other long term (current) drug therapy: Secondary | ICD-10-CM | POA: Diagnosis not present

## 2016-11-25 DIAGNOSIS — F17210 Nicotine dependence, cigarettes, uncomplicated: Secondary | ICD-10-CM | POA: Diagnosis not present

## 2016-11-25 DIAGNOSIS — O98312 Other infections with a predominantly sexual mode of transmission complicating pregnancy, second trimester: Secondary | ICD-10-CM | POA: Diagnosis not present

## 2016-11-25 DIAGNOSIS — O23592 Infection of other part of genital tract in pregnancy, second trimester: Secondary | ICD-10-CM

## 2016-11-25 DIAGNOSIS — O99332 Smoking (tobacco) complicating pregnancy, second trimester: Secondary | ICD-10-CM | POA: Insufficient documentation

## 2016-11-25 DIAGNOSIS — Z3A21 21 weeks gestation of pregnancy: Secondary | ICD-10-CM | POA: Insufficient documentation

## 2016-11-25 DIAGNOSIS — A5901 Trichomonal vulvovaginitis: Secondary | ICD-10-CM | POA: Diagnosis present

## 2016-11-25 DIAGNOSIS — O26892 Other specified pregnancy related conditions, second trimester: Secondary | ICD-10-CM | POA: Diagnosis not present

## 2016-11-25 DIAGNOSIS — Z8249 Family history of ischemic heart disease and other diseases of the circulatory system: Secondary | ICD-10-CM | POA: Insufficient documentation

## 2016-11-25 DIAGNOSIS — O09892 Supervision of other high risk pregnancies, second trimester: Secondary | ICD-10-CM

## 2016-11-25 DIAGNOSIS — Z811 Family history of alcohol abuse and dependence: Secondary | ICD-10-CM | POA: Insufficient documentation

## 2016-11-25 HISTORY — DX: Personal history of other infectious and parasitic diseases: Z86.19

## 2016-11-25 LAB — WET PREP, GENITAL
CLUE CELLS WET PREP: NONE SEEN
SPERM: NONE SEEN
YEAST WET PREP: NONE SEEN

## 2016-11-25 LAB — URINALYSIS, ROUTINE W REFLEX MICROSCOPIC
Bilirubin Urine: NEGATIVE
GLUCOSE, UA: NEGATIVE mg/dL
HGB URINE DIPSTICK: NEGATIVE
Ketones, ur: NEGATIVE mg/dL
NITRITE: NEGATIVE
Protein, ur: NEGATIVE mg/dL
SPECIFIC GRAVITY, URINE: 1.019 (ref 1.005–1.030)
pH: 6 (ref 5.0–8.0)

## 2016-11-25 MED ORDER — METRONIDAZOLE 500 MG PO TABS
2000.0000 mg | ORAL_TABLET | Freq: Once | ORAL | Status: AC
  Administered 2016-11-25: 2000 mg via ORAL
  Filled 2016-11-25: qty 4

## 2016-11-25 NOTE — MAU Provider Note (Signed)
History     CSN: [id number]  Arrival date and time: 11/25/16 1800   First Provider Initiated Contact with Patient 11/25/16 1822      Chief Complaint  Patient presents with  . Abdominal Pain   HPI  Destiny Schneider is a 36 y.o. [id number] at [redacted]w[redacted]d by LMP who presents with abdominal pain. Reports lower abdominal pain x 1 week. Pain comes & goes. Occurs once per hour & lasts 30-60 seconds at a time. Currently no pain. Standing at work makes pain worse. Has not treated pain. Denies vaginal bleeding, LOF, vaginal discharge, fever, recent intercourse, or n/v/d. Has not started prenatal care yet d/t pending medicaid.  Was treated for trichomonas in January; states partner was treated & they waited >1 week before resuming intercourse.   OB History    Gravida Para Term Preterm AB Living   3 1   1 1 1    SAB TAB Ectopic Multiple Live Births   1       1      Past Medical History:  Diagnosis Date  . Hx of trichomoniasis 09/2016  . Preterm labor     History reviewed. No pertinent surgical history.  Family History  Problem Relation Age of Onset  . Varicose Veins Mother   . Alcohol abuse Father   . Drug abuse Father     Social History  Substance Use Topics  . Smoking status: Current Every Day Smoker    Packs/day: 0.50  . Smokeless tobacco: Former Neurosurgeon  . Alcohol use 0.6 oz/week    1 Shots of liquor per week    Allergies: No Known Allergies  Prescriptions Prior to Admission  Medication Sig Dispense Refill Last Dose  . albuterol (PROVENTIL HFA;VENTOLIN HFA) 108 (90 BASE) MCG/ACT inhaler Inhale 1-2 puffs into the lungs every 6 (six) hours as needed for wheezing or shortness of breath.   Not Taking at Unknown time  . Ibuprofen-Diphenhydramine Cit (ADVIL PM PO) Take 1 tablet by mouth at bedtime as needed (sleep).   Past Week at Unknown time    Review of Systems  Constitutional: Negative.   Gastrointestinal: Positive for abdominal pain. Negative for constipation, diarrhea,  nausea and vomiting.  Genitourinary: Negative for dyspareunia, dysuria, vaginal bleeding and vaginal discharge.   Physical Exam   Blood pressure 118/81, temperature 97.9 F (36.6 C), temperature source Oral, resp. rate 16, height 5' 6.5" (1.689 m), weight 123 lb 1.6 oz (55.8 kg), last menstrual period 08/03/2016.  Physical Exam  Nursing note and vitals reviewed. Constitutional: She is oriented to person, place, and time. She appears well-developed and well-nourished. No distress.  HENT:  Head: Normocephalic and atraumatic.  Eyes: Conjunctivae are normal. Right eye exhibits no discharge. Left eye exhibits no discharge. No scleral icterus.  Neck: Normal range of motion.  Cardiovascular: Normal rate, regular rhythm and normal heart sounds.   No murmur heard. Respiratory: Effort normal and breath sounds normal. No respiratory distress. She has no wheezes.  GI: Soft.  Genitourinary: Cervix exhibits no motion tenderness and no friability. No bleeding in the vagina. Vaginal discharge (moderate amount of thin white frothy discharge) found.  Genitourinary Comments: Cervix ext 0.5 cm/thick  Neurological: She is alert and oriented to person, place, and time.  Skin: Skin is warm and dry. She is not diaphoretic.  Psychiatric: She has a normal mood and affect. Her behavior is normal. Judgment and thought content normal.    MAU Course  Procedures Results for orders placed or performed during  the hospital encounter of 11/25/16 (from the past 24 hour(s))  Urinalysis, Routine w reflex microscopic     Status: Abnormal   Collection Time: 11/25/16  6:03 PM  Result Value Ref Range   Color, Urine YELLOW YELLOW   APPearance HAZY (A) CLEAR   Specific Gravity, Urine 1.019 1.005 - 1.030   pH 6.0 5.0 - 8.0   Glucose, UA NEGATIVE NEGATIVE mg/dL   Hgb urine dipstick NEGATIVE NEGATIVE   Bilirubin Urine NEGATIVE NEGATIVE   Ketones, ur NEGATIVE NEGATIVE mg/dL   Protein, ur NEGATIVE NEGATIVE mg/dL   Nitrite  NEGATIVE NEGATIVE   Leukocytes, UA MODERATE (A) NEGATIVE   RBC / HPF 0-5 0 - 5 RBC/hpf   WBC, UA 6-30 0 - 5 WBC/hpf   Bacteria, UA RARE (A) NONE SEEN   Squamous Epithelial / LPF 6-30 (A) NONE SEEN   Mucous PRESENT   Wet prep, genital     Status: Abnormal   Collection Time: 11/25/16  6:41 PM  Result Value Ref Range   Yeast Wet Prep HPF POC NONE SEEN NONE SEEN   Trich, Wet Prep PRESENT (A) NONE SEEN   Clue Cells Wet Prep HPF POC NONE SEEN NONE SEEN   WBC, Wet Prep HPF POC MANY (A) NONE SEEN   Sperm NONE SEEN     MDM FHT 156 by doppler GC/CT & wet prep Wet prep + trich -- flagyl 2 gm PO in MAU TVUS -- Cervical length 3 cm  Dating changed to 8660w2d by ultrasound  Assessment and Plan  A: 1. Trichomonal vaginitis during pregnancy in second trimester   2. History of preterm delivery, currently pregnant in second trimester   3. Abdominal pain affecting pregnancy    P: Discharge home Expedited partner tx rx & info sheet given to patient -- no intercourse x 1 week after both treated [facility] to [facility] care ASAP d/t ob history PTL precautions Discussed reasons to return to MAU GC/CT pending  Destiny Schneider 11/25/2016, 6:21 PM

## 2016-11-25 NOTE — MAU Note (Signed)
Patient presents with lower abdominal pain come and goes, pressure, no dysuria, no vaginal bleeding.

## 2016-11-25 NOTE — Discharge Instructions (Signed)
. °Preterm Labor and Birth Information °The normal length of a pregnancy is 39-41 weeks. Preterm labor is when labor starts before 37 completed weeks of pregnancy. °What are the risk factors for preterm labor? °Preterm labor is more likely to occur in women who: °· Have certain infections during pregnancy such as a bladder infection, sexually transmitted infection, or infection inside the uterus (chorioamnionitis). °· Have a shorter-than-normal cervix. °· Have gone into preterm labor before. °· Have had surgery on their cervix. °· Are younger than age 17 or older than age 35. °· Are African American. °· Are pregnant with twins or multiple babies (multiple gestation). °· Take street drugs or smoke while pregnant. °· Do not gain enough weight while pregnant. °· Became pregnant shortly after having been pregnant. °What are the symptoms of preterm labor? °Symptoms of preterm labor include: °· Cramps similar to those that can happen during a menstrual period. The cramps may happen with diarrhea. °· Pain in the abdomen or lower back. °· Regular uterine contractions that may feel like tightening of the abdomen. °· A feeling of increased pressure in the pelvis. °· Increased watery or bloody mucus discharge from the vagina. °· Water breaking (ruptured amniotic sac). °Why is it important to recognize signs of preterm labor? °It is important to recognize signs of preterm labor because babies who are born prematurely may not be fully developed. This can put them at an increased risk for: °· Long-term (chronic) heart and lung problems. °· Difficulty immediately after birth with regulating body systems, including blood sugar, body temperature, heart rate, and breathing rate. °· Bleeding in the brain. °· Cerebral palsy. °· Learning difficulties. °· Death. °These risks are highest for babies who are born before 34 weeks of pregnancy. °How is preterm labor treated? °Treatment depends on the length of your pregnancy, your condition,  and the health of your baby. It may involve: °· Having a stitch (suture) placed in your cervix to prevent your cervix from opening too early (cerclage). °· Taking or being given medicines, such as: °¨ Hormone medicines. These may be given early in pregnancy to help support the pregnancy. °¨ Medicine to stop contractions. °¨ Medicines to help mature the baby’s lungs. These may be prescribed if the risk of delivery is high. °¨ Medicines to prevent your baby from developing cerebral palsy. °If the labor happens before 34 weeks of pregnancy, you may need to stay in the hospital. °What should I do if I think I am in preterm labor? °If you think that you are going into preterm labor, call your health care provider right away. °How can I prevent preterm labor in future pregnancies? °To increase your chance of having a full-term pregnancy: °· Do not use any tobacco products, such as cigarettes, chewing tobacco, and e-cigarettes. If you need help quitting, ask your health care provider. °· Do not use street drugs or medicines that have not been prescribed to you during your pregnancy. °· Talk with your health care provider before taking any herbal supplements, even if you have been taking them regularly. °· Make sure you gain a healthy amount of weight during your pregnancy. °· Watch for infection. If you think that you might have an infection, get it checked right away. °· Make sure to tell your health care provider if you have gone into preterm labor before. °This information is not intended to replace advice given to you by your health care provider. Make sure you discuss any questions you have with your   care provider. Document Released: 11/19/2003 Document Revised: 02/09/2016 Document Reviewed: 01/20/2016 Elsevier Interactive Patient Education  2017 [employer]. Trichomoniasis Trichomoniasis is an STI (sexually transmitted infection) that can affect both women and men. In women, the outer area of the female  genitalia (vulva) and the vagina are affected. In men, the penis is mainly affected, but the prostate and other reproductive organs can also be involved. This condition can be treated with medicine. It often has no symptoms (is asymptomatic), especially in men. What are the causes? This condition is caused by an organism called Trichomonas vaginalis. Trichomoniasis most often spreads from person to person (is contagious) through sexual contact. What increases the risk? The following factors may make you more likely to develop this condition:  Having unprotected sexual intercourse.  Having sexual intercourse with a partner who has trichomoniasis.  Having multiple sexual partners.  Having had previous trichomoniasis infections or other STIs. What are the signs or symptoms? In women, symptoms of trichomoniasis include:  Abnormal vaginal discharge that is clear, white, gray, or yellow-green and foamy and has an unusual "fishy" odor.  Itching and irritation of the vagina and vulva.  Burning or pain during urination or sexual intercourse.  Genital redness and swelling. In men, symptoms of trichomoniasis include:  Penile discharge that may be foamy or contain pus.  Pain in the penis. This may happen only when urinating.  Itching or irritation inside the penis.  Burning after urination or ejaculation. How is this diagnosed? In women, this condition may be found during a routine Pap test or physical exam. It may be found in men during a routine physical exam. Your health care provider may perform tests to help diagnose this infection, such as:  Urine tests (men and women).  The following in women:  Testing the pH of the vagina.  A vaginal swab test that checks for the Trichomonas vaginalis organism.  Testing vaginal secretions. Your health care provider may test you for other STIs, including HIV (human immunodeficiency virus). How is this treated? This condition is treated with  medicine taken by mouth (orally), such as metronidazole or tinidazole to fight the infection. Your sexual partner(s) may also need to be tested and treated.  If you are a woman and you plan to become pregnant or think you may be pregnant, tell your health care provider right away. Some medicines that are used to treat the infection should not be taken during pregnancy. Your health care provider may recommend over-the-counter medicines or creams to help relieve itching or irritation. You may be tested for infection again 3 months after treatment. Follow these instructions at home:  Take and use over-the-counter and prescription medicines, including creams, only as told by your health care provider.  Do not have sexual intercourse until one week after you finish your medicine, or until your health care provider approves. Ask your health care provider when you may resume sexual intercourse.  (Women) Do not douche or wear tampons while you have the infection.  Discuss your infection with your sexual partner(s). Make sure that your partner gets tested and treated, if necessary.  Keep all follow-up visits as told by your health care provider. This is important. How is this prevented?  Use condoms every time you have sex. Using condoms correctly and consistently can help protect against STIs.  Avoid having multiple sexual partners.  Talk with your sexual partner about any symptoms that either of you may have, as well as any history of STIs.  Get  tested for STIs and STDs (sexually transmitted diseases) before you have sex. Ask your partner to do the same.  Do not have sexual contact if you have symptoms of trichomoniasis or another STI. Contact a health care provider if:  You still have symptoms after you finish your medicine.  You develop pain in your abdomen.  You have pain when you urinate.  You have bleeding after sexual intercourse.  You develop a rash.  You feel nauseous or you  vomit.  You plan to become pregnant or think you may be pregnant. Summary  Trichomoniasis is an STI (sexually transmitted infection) that can affect both women and men.  This condition often has no symptoms (is asymptomatic), especially in men.  You should not have sexual intercourse until one week after you finish your medicine, or until your health care provider approves. Ask your health care provider when you may resume sexual intercourse.  Discuss your infection with your sexual partner. Make sure that your partner gets tested and treated, if necessary. This information is not intended to replace advice given to you by your health care provider. Make sure you discuss any questions you have with your health care provider. Document Released: 02/22/2001 Document Revised: 07/22/2016 Document Reviewed: 07/22/2016 Elsevier Interactive Patient Education  2017 [employer]

## 2016-11-28 LAB — GC/CHLAMYDIA PROBE AMP (~~LOC~~) NOT AT ARMC
Chlamydia: NEGATIVE
NEISSERIA GONORRHEA: NEGATIVE

## 2016-11-30 ENCOUNTER — Telehealth: Payer: Self-pay | Admitting: General Practice

## 2016-11-30 DIAGNOSIS — O09212 Supervision of pregnancy with history of pre-term labor, second trimester: Principal | ICD-10-CM

## 2016-11-30 DIAGNOSIS — O09892 Supervision of other high risk pregnancies, second trimester: Secondary | ICD-10-CM

## 2016-11-30 NOTE — Telephone Encounter (Signed)
Per Judeth HornErin Lawrence, patient needs ultrasound and cervical length scheduled prior to new OB appt. Scheduled for 4/3 @ 145. Called patient and informed her of appt. Patient verbalized understanding & had no questions

## 2016-12-13 ENCOUNTER — Other Ambulatory Visit: Payer: Self-pay | Admitting: Student

## 2016-12-13 ENCOUNTER — Encounter: Payer: Self-pay | Admitting: Student

## 2016-12-13 ENCOUNTER — Ambulatory Visit (HOSPITAL_COMMUNITY)
Admission: RE | Admit: 2016-12-13 | Discharge: 2016-12-13 | Disposition: A | Discharge status: Home / Self Care | Payer: Medicaid Other | Source: Ambulatory Visit | Attending: Student | Admitting: Student

## 2016-12-13 DIAGNOSIS — O09212 Supervision of pregnancy with history of pre-term labor, second trimester: Principal | ICD-10-CM

## 2016-12-13 DIAGNOSIS — O0932 Supervision of pregnancy with insufficient antenatal care, second trimester: Secondary | ICD-10-CM | POA: Diagnosis not present

## 2016-12-13 DIAGNOSIS — O09522 Supervision of elderly multigravida, second trimester: Secondary | ICD-10-CM | POA: Diagnosis not present

## 2016-12-13 DIAGNOSIS — O09892 Supervision of other high risk pregnancies, second trimester: Secondary | ICD-10-CM

## 2016-12-13 DIAGNOSIS — Z3A23 23 weeks gestation of pregnancy: Secondary | ICD-10-CM | POA: Insufficient documentation

## 2016-12-13 DIAGNOSIS — Z3689 Encounter for other specified antenatal screening: Secondary | ICD-10-CM | POA: Diagnosis not present

## 2016-12-13 DIAGNOSIS — O26872 Cervical shortening, second trimester: Secondary | ICD-10-CM | POA: Insufficient documentation

## 2016-12-13 DIAGNOSIS — Z363 Encounter for antenatal screening for malformations: Secondary | ICD-10-CM | POA: Diagnosis not present

## 2016-12-15 ENCOUNTER — Ambulatory Visit (INDEPENDENT_AMBULATORY_CARE_PROVIDER_SITE_OTHER): Payer: Medicaid Other | Admitting: Obstetrics and Gynecology

## 2016-12-15 ENCOUNTER — Encounter: Payer: Self-pay | Admitting: Obstetrics and Gynecology

## 2016-12-15 VITALS — BP 132/72 | HR 92 | Wt 128.4 lb

## 2016-12-15 DIAGNOSIS — A5901 Trichomonal vulvovaginitis: Secondary | ICD-10-CM

## 2016-12-15 DIAGNOSIS — O09522 Supervision of elderly multigravida, second trimester: Secondary | ICD-10-CM

## 2016-12-15 DIAGNOSIS — O09892 Supervision of other high risk pregnancies, second trimester: Secondary | ICD-10-CM

## 2016-12-15 DIAGNOSIS — O23592 Infection of other part of genital tract in pregnancy, second trimester: Secondary | ICD-10-CM

## 2016-12-15 DIAGNOSIS — O0932 Supervision of pregnancy with insufficient antenatal care, second trimester: Secondary | ICD-10-CM | POA: Insufficient documentation

## 2016-12-15 DIAGNOSIS — O09212 Supervision of pregnancy with history of pre-term labor, second trimester: Secondary | ICD-10-CM | POA: Diagnosis not present

## 2016-12-15 DIAGNOSIS — O26872 Cervical shortening, second trimester: Secondary | ICD-10-CM

## 2016-12-15 NOTE — Progress Notes (Signed)
New OB Note  12/15/2016   Clinic: [facility]  Chief Complaint: NOB  Transfer of Care Patient: no  History of Present Illness: Destiny Schneider is a 36 y.o. [id number] @ 24/1 weeks (EDC 7/25, based on 21wk u/s) Patient's last menstrual period was 08/03/2016.  Preg complicated by has Trichomonal vaginitis during pregnancy in second trimester; Hx of preterm delivery, currently pregnant, second trimester; Short cervix during pregnancy in second trimester; Late prenatal care affecting pregnancy in second trimester; and AMA (advanced maternal age) multigravida 35+, second trimester on her problem list.   Any events prior to today's visit: yes, shortened cervix LMP: unknown She was using no method when she conceived.  She has Negative signs or symptoms of nausea/vomiting of pregnancy. She has Negative signs or symptoms of miscarriage or preterm labor On any different medications around the time she found out she was pregnant: No   ROS: A 12-point review of systems was performed and negative, except as stated in the above HPI.  OBGYN History: As per HPI. OB History  Gravida Para Term Preterm AB Living  SAB TAB Ectopic Multiple Live Births  1       1    # Outcome Date GA Lbr Len/2nd Weight Sex Delivery Anes PTL Lv  3 Current           2 SAB 1999 [redacted]w[redacted]d         1 Preterm 1997 [redacted]w[redacted]d   F Vag-Spont EPI Y LIV    Obstetric Comments  G1: PPROM and then stayed pregnant for about two weeks and then had delivery  G2: patient states she had no PNC was 5 months and sounds like she had sudden sensation of having to push/low belly pressure. She came to the hospital here and had SVD with NN death.    Any issues with any prior pregnancies: yes Prior children are healthy, doing well, and without any problems or issues: yes (prior 32wk SVD) History of pap smears: unknown   Past Medical History: Past Medical History:  Diagnosis Date  . Hx of trichomoniasis 09/2016  .  Preterm labor     Past Surgical History: History reviewed. No pertinent surgical history.  Family History:  Family History  Problem Relation Age of Onset  . Varicose Veins Mother   . Alcohol abuse Father   . Drug abuse Father    She denies any female cancers, bleeding or blood clotting disorders.  She denies any history of mental retardation, birth defects or genetic disorders in her or the FOB's history  Social History:  Social History   Social History  . Marital status: Married    Spouse name: N/A  . Number of children: N/A  . Years of education: N/A   Occupational History  . Not on file.   Social History Main Topics  . Smoking status: Current Every Day Smoker    Packs/day: 0.50  . Smokeless tobacco: Former Neurosurgeon  . Alcohol use 0.6 oz/week    1 Shots of liquor per week  . Drug use: No  . Sexual activity: Yes    Birth control/ protection: None   Other Topics Concern  . Not on file   Social History Narrative  . No narrative on file    Allergy: No Known Allergies  Health Maintenance:  Mammogram Up to Date: not applicable  Current Outpatient Medications: PNV  Physical Exam:   BP 132/72   Pulse 92  Wt 128 lb 6.4 oz (58.2 kg)   LMP 08/03/2016   BMI 20.41 kg/m  Body mass index is 20.41 kg/m.   Vag. Bleeding: None. Fundal height: 25 FHTs: 160  General appearance: Well nourished, well developed female in no acute distress.  Neck:  Supple, normal appearance, and no thyromegaly  Cardiovascular: S1, S2 normal, no murmur, rub or gallop, regular rate and rhythm Respiratory:  Clear to auscultation bilateral. Normal respiratory effort Abdomen: positive bowel sounds and no masses, hernias; diffusely non tender to palpation, non distended Breasts: breasts appear normal, no suspicious masses, no skin or nipple changes or axillary nodes and normal palpation Neuro/Psych:  Normal mood and affect.  Skin:  Warm and dry.   Laboratory: 3/16: +trich, u/a negative  and gc/ct  Imaging:  4/3: CL 1.5 to 1.7cm, normal anatomy-->MFM placed pessary 3/16: 21/2 on u/s. CL 3cm  Assessment: Pt stabe  Plan: 1. AMA (advanced maternal age) multigravida 35+, second trimester Late PNC. Anatomy u/s with MFM negative. TSH, baseline pre-x labs ordered. BTL papers signed today.   2. Trichomonal vaginitis during pregnancy in second trimester TOC nv  3. Hx of preterm delivery, currently pregnant, second trimester Late PNC. Sounds like patient would've been a ppx cerclage and 17p candidate.   4. Short cervix during pregnancy in second trimester Further u/s not indicated per MFM. Follow patient s/s  5. Late prenatal care affecting pregnancy in second trimester UDS. Patient states didn't establish care until now due busy social issues.   Problem list reviewed and updated.  Follow up in 2 weeks and 2hr GTT   >50% of 30 min visit spent on counseling and coordination of care.     Cornelia Copa MD Attending [facility])

## 2016-12-16 LAB — POCT URINALYSIS DIP (DEVICE)
BILIRUBIN URINE: NEGATIVE
Glucose, UA: NEGATIVE mg/dL
Hgb urine dipstick: NEGATIVE
Ketones, ur: NEGATIVE mg/dL
NITRITE: NEGATIVE
PH: 7 (ref 5.0–8.0)
Protein, ur: NEGATIVE mg/dL
Specific Gravity, Urine: 1.02 (ref 1.005–1.030)
Urobilinogen, UA: 2 mg/dL — ABNORMAL HIGH (ref 0.0–1.0)

## 2016-12-17 LAB — URINE CULTURE

## 2016-12-20 LAB — OBSTETRIC PANEL, INCLUDING HIV
Antibody Screen: NEGATIVE
BASOS ABS: 0 10*3/uL (ref 0.0–0.2)
Basos: 0 %
EOS (ABSOLUTE): 0.1 10*3/uL (ref 0.0–0.4)
EOS: 1 %
HEMOGLOBIN: 10 g/dL — AB (ref 11.1–15.9)
HEP B S AG: NEGATIVE
HIV Screen 4th Generation wRfx: NONREACTIVE
Hematocrit: 27.9 % — ABNORMAL LOW (ref 34.0–46.6)
IMMATURE GRANS (ABS): 0.1 10*3/uL (ref 0.0–0.1)
IMMATURE GRANULOCYTES: 1 %
LYMPHS ABS: 1.5 10*3/uL (ref 0.7–3.1)
LYMPHS: 21 %
MCH: 32.3 pg (ref 26.6–33.0)
MCHC: 35.8 g/dL — ABNORMAL HIGH (ref 31.5–35.7)
MCV: 90 fL (ref 79–97)
Monocytes Absolute: 0.5 10*3/uL (ref 0.1–0.9)
Monocytes: 7 %
NEUTROS PCT: 70 %
Neutrophils Absolute: 5.1 10*3/uL (ref 1.4–7.0)
PLATELETS: 278 10*3/uL (ref 150–379)
RBC: 3.1 x10E6/uL — AB (ref 3.77–5.28)
RDW: 14.1 % (ref 12.3–15.4)
RH TYPE: POSITIVE
RPR: NONREACTIVE
Rubella Antibodies, IGG: 2.16 index (ref >0.99)
WBC: 7.3 10*3/uL (ref 3.4–10.8)

## 2016-12-20 LAB — CMP AND LIVER
ALBUMIN: 3.8 g/dL (ref 3.5–5.5)
ALT: 6 IU/L (ref 0–32)
AST: 14 IU/L (ref 0–40)
Alkaline Phosphatase: 59 IU/L (ref 39–117)
BUN: 8 mg/dL (ref 6–20)
Bilirubin Total: 0.3 mg/dL (ref 0.0–1.2)
Bilirubin, Direct: 0.07 mg/dL (ref 0.00–0.40)
CALCIUM: 8.7 mg/dL (ref 8.7–10.2)
CO2: 21 mmol/L (ref 18–29)
CREATININE: 0.56 mg/dL — AB (ref 0.57–1.00)
Chloride: 99 mmol/L (ref 96–106)
GFR calc non Af Amer: 119 mL/min/{1.73_m2} (ref >59)
GFR, EST AFRICAN AMERICAN: 138 mL/min/{1.73_m2} (ref >59)
Glucose: 66 mg/dL (ref 65–99)
POTASSIUM: 3.6 mmol/L (ref 3.5–5.2)
SODIUM: 136 mmol/L (ref 134–144)
TOTAL PROTEIN: 6.5 g/dL (ref 6.0–8.5)

## 2016-12-20 LAB — TSH: TSH: 1.05 u[IU]/mL (ref 0.450–4.500)

## 2016-12-20 LAB — PROTEIN / CREATININE RATIO, URINE
CREATININE, UR: 121.1 mg/dL
PROTEIN UR: 21.5 mg/dL
PROTEIN/CREAT RATIO: 178 mg/g{creat} (ref 0–200)

## 2016-12-20 LAB — HEMOGLOBINOPATHY EVALUATION
Ferritin: 12 ng/mL — ABNORMAL LOW (ref 15–150)
HGB A: 97.2 % (ref 96.4–98.8)
HGB C: 0 %
Hgb A2 Quant: 2.8 % (ref 1.8–3.2)
Hgb F Quant: 0 % (ref 0.0–2.0)
Hgb S: 0 %
Hgb Solubility: NEGATIVE
Hgb Variant: 0 %

## 2016-12-20 LAB — TOXASSURE SELECT 13 (MW), URINE

## 2016-12-21 LAB — CYSTIC FIBROSIS MUTATION 97: GENE DIS ANAL CARRIER INTERP BLD/T-IMP: NOT DETECTED

## 2016-12-25 ENCOUNTER — Encounter: Payer: Self-pay | Admitting: Obstetrics and Gynecology

## 2016-12-25 DIAGNOSIS — O99019 Anemia complicating pregnancy, unspecified trimester: Secondary | ICD-10-CM | POA: Insufficient documentation

## 2016-12-25 DIAGNOSIS — F1210 Cannabis abuse, uncomplicated: Secondary | ICD-10-CM | POA: Insufficient documentation

## 2016-12-25 DIAGNOSIS — O0990 Supervision of high risk pregnancy, unspecified, unspecified trimester: Secondary | ICD-10-CM | POA: Insufficient documentation

## 2017-01-02 ENCOUNTER — Ambulatory Visit (INDEPENDENT_AMBULATORY_CARE_PROVIDER_SITE_OTHER): Payer: Medicaid Other | Admitting: Obstetrics & Gynecology

## 2017-01-02 ENCOUNTER — Encounter (HOSPITAL_COMMUNITY): Payer: Self-pay

## 2017-01-02 ENCOUNTER — Other Ambulatory Visit (HOSPITAL_COMMUNITY)
Admission: RE | Admit: 2017-01-02 | Discharge: 2017-01-02 | Disposition: A | Discharge status: Home / Self Care | Payer: Medicaid Other | Source: Ambulatory Visit | Attending: Obstetrics & Gynecology | Admitting: Obstetrics & Gynecology

## 2017-01-02 ENCOUNTER — Inpatient Hospital Stay (HOSPITAL_COMMUNITY)
Admission: AD | Admit: 2017-01-02 | Discharge: 2017-01-02 | Disposition: A | Discharge status: Home / Self Care | Payer: Medicaid Other | Source: Ambulatory Visit | Attending: Obstetrics & Gynecology | Admitting: Obstetrics & Gynecology

## 2017-01-02 VITALS — BP 125/71 | HR 97 | Wt 133.6 lb

## 2017-01-02 DIAGNOSIS — Z8249 Family history of ischemic heart disease and other diseases of the circulatory system: Secondary | ICD-10-CM | POA: Diagnosis not present

## 2017-01-02 DIAGNOSIS — Z0371 Encounter for suspected problem with amniotic cavity and membrane ruled out: Secondary | ICD-10-CM | POA: Diagnosis not present

## 2017-01-02 DIAGNOSIS — O0992 Supervision of high risk pregnancy, unspecified, second trimester: Secondary | ICD-10-CM

## 2017-01-02 DIAGNOSIS — Z3A26 26 weeks gestation of pregnancy: Secondary | ICD-10-CM | POA: Insufficient documentation

## 2017-01-02 DIAGNOSIS — Z811 Family history of alcohol abuse and dependence: Secondary | ICD-10-CM | POA: Diagnosis not present

## 2017-01-02 DIAGNOSIS — D649 Anemia, unspecified: Secondary | ICD-10-CM

## 2017-01-02 DIAGNOSIS — O09212 Supervision of pregnancy with history of pre-term labor, second trimester: Secondary | ICD-10-CM | POA: Diagnosis not present

## 2017-01-02 DIAGNOSIS — O0990 Supervision of high risk pregnancy, unspecified, unspecified trimester: Secondary | ICD-10-CM

## 2017-01-02 DIAGNOSIS — Z79899 Other long term (current) drug therapy: Secondary | ICD-10-CM | POA: Insufficient documentation

## 2017-01-02 DIAGNOSIS — Z113 Encounter for screening for infections with a predominantly sexual mode of transmission: Secondary | ICD-10-CM

## 2017-01-02 DIAGNOSIS — Z23 Encounter for immunization: Secondary | ICD-10-CM | POA: Diagnosis not present

## 2017-01-02 DIAGNOSIS — O99012 Anemia complicating pregnancy, second trimester: Secondary | ICD-10-CM

## 2017-01-02 DIAGNOSIS — O09522 Supervision of elderly multigravida, second trimester: Secondary | ICD-10-CM

## 2017-01-02 DIAGNOSIS — O99332 Smoking (tobacco) complicating pregnancy, second trimester: Secondary | ICD-10-CM | POA: Diagnosis present

## 2017-01-02 LAB — URINALYSIS, ROUTINE W REFLEX MICROSCOPIC
BILIRUBIN URINE: NEGATIVE
Glucose, UA: NEGATIVE mg/dL
Hgb urine dipstick: NEGATIVE
KETONES UR: NEGATIVE mg/dL
Nitrite: NEGATIVE
Protein, ur: NEGATIVE mg/dL
SPECIFIC GRAVITY, URINE: 1.02 (ref 1.005–1.030)
pH: 6 (ref 5.0–8.0)

## 2017-01-02 LAB — AMNISURE RUPTURE OF MEMBRANE (ROM) NOT AT ARMC: AMNISURE: NEGATIVE

## 2017-01-02 NOTE — Progress Notes (Signed)
BTS Consent resigned

## 2017-01-02 NOTE — MAU Note (Signed)
Pt sent from office for possible ROM. Amnisure ordered.

## 2017-01-02 NOTE — MAU Provider Note (Signed)
History     CSN: [id number]  Arrival date and time: 01/02/17 4098   First Provider Initiated Contact with Patient 01/02/17 1006      Chief Complaint  Patient presents with  . Possible ROM   HPI   [name] is a 36 y.o. female [phone] @ [redacted]w[redacted]d here from the [facility]- due to possible PPROM. Patient is here for an Therapist, occupational. Please see Dr. Betha Loa office note.   She denies pain or vaginal bleeding. She denies leaking of fluid.    OB History    Gravida Para Term Preterm AB Living   SAB TAB Ectopic Multiple Live Births   1       1      Obstetric Comments   G1: PPROM and then stayed pregnant for about two weeks and then had delivery G2: patient states she had no PNC was 5 months and sounds like she had sudden sensation of having to push/low belly pressure. She came to the hospital here and had SVD with NN  death.       Past Medical History:  Diagnosis Date  . Hx of trichomoniasis 09/2016  . Preterm labor     Past Surgical History:  Procedure Laterality Date  . WISDOM TOOTH EXTRACTION      Family History  Problem Relation Age of Onset  . Varicose Veins Mother   . Alcohol abuse Father   . Drug abuse Father     Social History  Substance Use Topics  . Smoking status: Current Every Day Smoker    Packs/day: 0.50  . Smokeless tobacco: Former Neurosurgeon  . Alcohol use 0.6 oz/week    1 Shots of liquor per week    Allergies: No Known Allergies  Prescriptions Prior to Admission  Medication Sig Dispense Refill Last Dose  . acetaminophen (TYLENOL) 500 MG tablet Take 500 mg by mouth every 6 (six) hours as needed for mild pain.   Past Month at Unknown time  . albuterol (PROVENTIL HFA;VENTOLIN HFA) 108 (90 BASE) MCG/ACT inhaler Inhale 1-2 puffs into the lungs every 6 (six) hours as needed for wheezing or shortness of breath.   Rescue   Results for orders placed or performed during the hospital encounter of 01/02/17 (from the past 48 hour(s))   Urinalysis, Routine w reflex microscopic     Status: Abnormal   Collection Time: 01/02/17  9:25 AM  Result Value Ref Range   Color, Urine YELLOW YELLOW   APPearance CLEAR CLEAR   Specific Gravity, Urine 1.020 1.005 - 1.030   pH 6.0 5.0 - 8.0   Glucose, UA NEGATIVE NEGATIVE mg/dL   Hgb urine dipstick NEGATIVE NEGATIVE   Bilirubin Urine NEGATIVE NEGATIVE   Ketones, ur NEGATIVE NEGATIVE mg/dL   Protein, ur NEGATIVE NEGATIVE mg/dL   Nitrite NEGATIVE NEGATIVE   Leukocytes, UA SMALL (A) NEGATIVE   RBC / HPF 0-5 0 - 5 RBC/hpf   WBC, UA 0-5 0 - 5 WBC/hpf   Bacteria, UA RARE (A) NONE SEEN   Squamous Epithelial / LPF 0-5 (A) NONE SEEN   Mucous PRESENT   Amnisure rupture of membrane (rom)not at [facility]     Status: None   Collection Time: 01/02/17  9:36 AM  Result Value Ref Range   Amnisure ROM NEGATIVE     Review of Systems  Constitutional: Negative for fever.  Gastrointestinal: Negative for abdominal pain.  Genitourinary: Positive for vaginal discharge. Negative for pelvic pain  and vaginal bleeding.   Physical Exam   Blood pressure 109/63, pulse 90, temperature 98 F (36.7 C), resp. rate 18, last menstrual period 08/03/2016.  Physical Exam  Constitutional: She is oriented to person, place, and time. She appears well-developed and well-nourished. No distress.  HENT:  Head: Normocephalic.  GI: Soft. She exhibits no distension.  Musculoskeletal: Normal range of motion.  Neurological: She is alert and oriented to person, place, and time.  Skin: Skin is warm. She is not diaphoretic.  Psychiatric: Her behavior is normal.   Fetal Tracing: Baseline: 135 bpm Variability: Moderate  Accelerations: 15x15 Decelerations: None Toco: Quiet   MAU Course  Procedures  None  MDM  Amnisure negative.  Cervix closed, thick.  Assessment and Plan   A:  1. Encounter for suspected premature rupture of amniotic membranes, with rupture of membranes not found     P:  Discharge home in  stable condition Preterm labor precautions Strict return precautions Follow up with [facility] as scheduled, or sooner if needed.  Duane Lope, NP 01/02/2017 10:28 AM

## 2017-01-02 NOTE — Discharge Instructions (Signed)
Preventing Preterm Birth °Preterm birth is when your baby is delivered between 20 weeks and 37 weeks of pregnancy. A full-term pregnancy lasts for at least 37 weeks. Preterm birth can be dangerous for your baby because the last few weeks of pregnancy are an important time for your baby's brain and lungs to grow. Many things can cause a baby to be born early. Sometimes the cause is not known. There are certain factors that make you more likely to experience preterm birth, such as: °· Having a previous baby born preterm. °· Being pregnant with twins or other multiples. °· Having had fertility treatment. °· Being overweight or underweight at the start of your pregnancy. °· Having any of the following during pregnancy: °¨ An infection, including a urinary tract infection (UTI) or an STI (sexually transmitted infection). °¨ High blood pressure. °¨ Diabetes. °¨ Vaginal bleeding. °· Being age 37 or older. °· Being age 37 or younger. °· Getting pregnant within 6 months of a previous pregnancy. °· Suffering extreme stress or physical or emotional abuse during pregnancy. °· Standing for long periods of time during pregnancy, such as working at a job that requires standing. °What are the risks? °The most serious risk of preterm birth is that the baby may not survive. This is more likely to happen if a baby is born before 34 weeks. Other risks and complications of preterm birth may include your baby having: °· Breathing problems. °· Brain damage that affects movement and coordination (cerebral palsy). °· Feeding difficulties. °· Vision or hearing problems. °· Infections or inflammation of the digestive tract (colitis). °· Developmental delays. °· Learning disabilities. °· Higher risk for diabetes, heart disease, and high blood pressure later in life. °What can I do to lower my risk? °Medical care °The most important thing you can do to lower your risk for preterm birth is to get routine medical care during pregnancy (prenatal  care). If you have a high risk of preterm birth, you may be referred to a health care provider who specializes in managing high-risk pregnancies (perinatologist). You may be given medicine to help prevent preterm birth. °Lifestyle changes °Certain lifestyle changes can also lower your risk of preterm birth: °· Wait at least 6 months after a pregnancy to become pregnant again. °· Try to plan pregnancy for when you are between 19 and 37 years old. °· Get to a healthy weight before getting pregnant. If you are overweight, work with your health care provider to safely lose weight. °· Do not use any products that contain nicotine or tobacco, such as cigarettes and e-cigarettes. If you need help quitting, ask your health care provider. °· Do not drink alcohol. °· Do not use drugs. °Where to find support: °For more support, consider: °· Talking with your health care provider. °· Talking with a therapist or substance abuse counselor, if you need help quitting. °· Working with a diet and nutrition specialist (dietitian) or a personal trainer to maintain a healthy weight. °· Joining a support group. °Where to find more information: °Learn more about preventing preterm birth from: °· Centers for Disease Control and Prevention: cdc.gov/reproductivehealth/maternalinfanthealth/pretermbirth.htm °· March of Dimes: marchofdimes.org/complications/premature-babies.aspx °· American Pregnancy Association: americanpregnancy.org/labor-and-birth/premature-labor °Contact a health care provider if: °· You have any of the following signs of preterm labor before 37 weeks: °¨ A change or increase in vaginal discharge. °¨ Fluid leaking from your vagina. °¨ Pressure or cramps in your lower abdomen. °¨ A backache that does not go away or gets worse. °¨   Regular tightening (contractions) in your lower abdomen. °Summary °· Preterm birth means having your baby during weeks 20-37 of pregnancy. °· Preterm birth may put your baby at risk for physical and  mental problems. °· Getting good prenatal care can help prevent preterm birth. °· You can lower your risk of preterm birth by making certain lifestyle changes, such as not smoking and not using alcohol. °This information is not intended to replace advice given to you by your health care provider. Make sure you discuss any questions you have with your health care provider. °Document Released: 10/13/2015 Document Revised: 05/07/2016 Document Reviewed: 05/07/2016 °Elsevier Interactive Patient Education © 2017 [employer] ° °

## 2017-01-02 NOTE — Progress Notes (Signed)
Educated pt Benefits of Breastfeeding for [employer]    PRENATAL VISIT NOTE  Subjective:  Destiny Schneider is a 36 y.o. [phone] at [redacted]w[redacted]d being seen today for ongoing prenatal care.  She is currently monitored for the following issues for this high-risk pregnancy and has Trichomonal vaginitis during pregnancy in second trimester; History of preterm delivery; Short cervix during pregnancy in second trimester; Late prenatal care affecting pregnancy in second trimester; AMA (advanced maternal age) multigravida 35+, second trimester; Marijuana abuse; Anemia in pregnancy; and Supervision of high risk pregnancy, antepartum on her problem list.  Patient reports increase and change in vaginal discharge for 5 days.  No sexual intercourse since easter.  Both partner and herself were treated for trich..  Contractions: Irritability. Vag. Bleeding: None.  Movement: Present. Denies leaking of fluid.   The following portions of the patient's history were reviewed and updated as appropriate: allergies, current medications, past family history, past medical history, past social history, past surgical history and problem list. Problem list updated.  Objective:   Vitals:   01/02/17 0748  BP: 125/71  Pulse: 97  Weight: 133 lb 9.6 oz (60.6 kg)    Fetal Status: Fetal Heart Rate (bpm): 168   Movement: Present     General:  Alert, oriented and cooperative. Patient is in no acute distress.  Skin: Skin is warm and dry. No rash noted.   Cardiovascular: Normal heart rate noted  Respiratory: Normal respiratory effort, no problems with respiration noted  Abdomen: Soft, gravid, appropriate for gestational age. Pain/Pressure: Present     Pelvic:  Cervical exam deferred       +pooling of thin white discharge, watery, no odor.  Pessary in place.  Cervix visually closed and appears thick.  Nitrizine not available in hospital anymore, microscope broken--send to MAU for Amniosure.    Extremities: Normal range of  motion.  Edema: None  Mental Status: Normal mood and affect. Normal behavior. Normal judgment and thought content.   Assessment and Plan:  Pregnancy: [id number] at [redacted]w[redacted]d  1. Supervision of high risk pregnancy, antepartum - Glucose Tolerance, 2 Hours w/1 Hour - CBC - Cervicovaginal ancillary only - Cytology - PAP  2.  Vaginal Discharge  Cervical exam deferred       +pooling of thin white discharge, watery, no odor.  Pessary in place.  Cervix visually closed and appears thick.  Nitrizine not available in hospital anymore, microscope broken--send to MAU for Amniosure.    Trich, Candida, BV, GC/Chlam sent. To MAU for amniosure and digital exam if not ruptured.    Preterm labor symptoms and general obstetric precautions including but not limited to vaginal bleeding, contractions, leaking of fluid and fetal movement were reviewed in detail with the patient. Please refer to After Visit Summary for other counseling recommendations.  Return in about 2 weeks (around 01/16/2017).   Lesly Dukes, MD

## 2017-01-03 LAB — CBC
HEMATOCRIT: 28.6 % — AB (ref 34.0–46.6)
Hemoglobin: 9.9 g/dL — ABNORMAL LOW (ref 11.1–15.9)
MCH: 32 pg (ref 26.6–33.0)
MCHC: 34.6 g/dL (ref 31.5–35.7)
MCV: 93 fL (ref 79–97)
PLATELETS: 272 10*3/uL (ref 150–379)
RBC: 3.09 x10E6/uL — ABNORMAL LOW (ref 3.77–5.28)
RDW: 14.4 % (ref 12.3–15.4)
WBC: 7.8 10*3/uL (ref 3.4–10.8)

## 2017-01-03 LAB — CYTOLOGY - PAP
Diagnosis: NEGATIVE
HPV (WINDOPATH): NOT DETECTED

## 2017-01-03 LAB — GLUCOSE TOLERANCE, 2 HOURS W/ 1HR
GLUCOSE, 1 HOUR: 116 mg/dL (ref 65–179)
GLUCOSE, 2 HOUR: 72 mg/dL (ref 65–152)
GLUCOSE, FASTING: 77 mg/dL (ref 65–91)

## 2017-01-03 LAB — CERVICOVAGINAL ANCILLARY ONLY
Bacterial vaginitis: POSITIVE — AB
Candida vaginitis: POSITIVE — AB
Chlamydia: NEGATIVE
NEISSERIA GONORRHEA: NEGATIVE
Trichomonas: POSITIVE — AB

## 2017-01-05 ENCOUNTER — Encounter: Payer: Self-pay | Admitting: *Deleted

## 2017-01-10 ENCOUNTER — Other Ambulatory Visit: Payer: Self-pay | Admitting: Obstetrics & Gynecology

## 2017-01-10 DIAGNOSIS — O23592 Infection of other part of genital tract in pregnancy, second trimester: Principal | ICD-10-CM

## 2017-01-10 DIAGNOSIS — A5901 Trichomonal vulvovaginitis: Secondary | ICD-10-CM

## 2017-01-10 MED ORDER — FLUCONAZOLE 150 MG PO TABS: 150.0000 mg | ORAL_TABLET | Freq: Once | ORAL | 3 refills | Status: AC

## 2017-01-10 MED ORDER — METRONIDAZOLE 500 MG PO TABS: ORAL_TABLET | ORAL | 0 refills | Status: DC

## 2017-01-10 NOTE — Progress Notes (Unsigned)
Panel positive for trich candida and BV. Rx for diflucan and flagyl Pt should be tested for all STDS and TOC for trich in 3 weeks. RN to call to tell patient to get prescriptions from pharmacy Pt needs to make sure partner is treated and abstain for 3 weeks.

## 2017-01-11 ENCOUNTER — Telehealth: Payer: Self-pay | Admitting: General Practice

## 2017-01-11 DIAGNOSIS — A599 Trichomoniasis, unspecified: Secondary | ICD-10-CM

## 2017-01-11 DIAGNOSIS — B9689 Other specified bacterial agents as the cause of diseases classified elsewhere: Secondary | ICD-10-CM

## 2017-01-11 DIAGNOSIS — N760 Acute vaginitis: Secondary | ICD-10-CM

## 2017-01-11 DIAGNOSIS — B379 Candidiasis, unspecified: Secondary | ICD-10-CM

## 2017-01-11 MED ORDER — FLUCONAZOLE 150 MG PO TABS: 150.0000 mg | ORAL_TABLET | Freq: Once | ORAL | 3 refills | Status: AC

## 2017-01-11 MED ORDER — METRONIDAZOLE 500 MG PO TABS: ORAL_TABLET | ORAL | 0 refills | Status: DC

## 2017-01-11 NOTE — Telephone Encounter (Signed)
Patient called into front office stating she is at the pharmacy and is having trouble getting her prescription. Called [facility] who states the prescription isn't covered by her insurance, no prior [place]. Called [facility] who states flagyl is $4 out of pocket. Spoke with patient and inquired about nearest [facility] and discussed prices. Patient states [facility] is closest to her and well pick up her prescriptions there. Patient had no questions

## 2017-01-11 NOTE — Telephone Encounter (Addendum)
Called and informed patient of the below information. Patient verbalized understanding to all and became upset and angry about "waiting 11 days for results that come back in 24 hours which is unacceptable." Apologized to patient for the delay and I understand why she is upset. Patient wants to know why she was wasn't contacted her because she told someone there was a change in her discharge and she thought something was wrong. Patient states something should've been sent in that day. Patient states she's got a baby inside of her and this can't be good for the baby. Apologized to patient for the delay and that shouldn't have happened. Patient wants someone to call her back to talk about this because this isn't acceptable. Told patient I will have my office manager contact her & apologized again. Discussed importance of taking iron and that she may need stool softeners with it. Patient verbalized understanding to all & had no questions. Patient will pick up medication    Message from Lesly Dukes, MD sent at 01/10/2017  8:17 PM EDT ----- Panel positive for trich candida and BV. Rx for diflucan and flagyl. Pt should be tested for all STDS and TOC for trich in 3 weeks. RN to call to tell patient to get prescriptions from pharmacy. Pt needs to make sure partner is treated and abstain for 3 weeks. Make sure patient is taking iron with orange juice or vitamin c.

## 2017-01-16 ENCOUNTER — Ambulatory Visit (INDEPENDENT_AMBULATORY_CARE_PROVIDER_SITE_OTHER): Payer: Medicaid Other | Admitting: Obstetrics and Gynecology

## 2017-01-16 VITALS — BP 123/67 | HR 101 | Wt 134.6 lb

## 2017-01-16 DIAGNOSIS — O23593 Infection of other part of genital tract in pregnancy, third trimester: Secondary | ICD-10-CM

## 2017-01-16 DIAGNOSIS — O26873 Cervical shortening, third trimester: Secondary | ICD-10-CM

## 2017-01-16 DIAGNOSIS — O09213 Supervision of pregnancy with history of pre-term labor, third trimester: Secondary | ICD-10-CM | POA: Diagnosis not present

## 2017-01-16 DIAGNOSIS — O0993 Supervision of high risk pregnancy, unspecified, third trimester: Secondary | ICD-10-CM

## 2017-01-16 DIAGNOSIS — A5901 Trichomonal vulvovaginitis: Secondary | ICD-10-CM

## 2017-01-16 DIAGNOSIS — O09522 Supervision of elderly multigravida, second trimester: Secondary | ICD-10-CM

## 2017-01-16 DIAGNOSIS — O09523 Supervision of elderly multigravida, third trimester: Secondary | ICD-10-CM

## 2017-01-16 DIAGNOSIS — O99013 Anemia complicating pregnancy, third trimester: Secondary | ICD-10-CM | POA: Diagnosis not present

## 2017-01-16 DIAGNOSIS — O0990 Supervision of high risk pregnancy, unspecified, unspecified trimester: Secondary | ICD-10-CM

## 2017-01-16 DIAGNOSIS — O26872 Cervical shortening, second trimester: Secondary | ICD-10-CM

## 2017-01-16 DIAGNOSIS — Z8751 Personal history of pre-term labor: Secondary | ICD-10-CM

## 2017-01-16 DIAGNOSIS — O23592 Infection of other part of genital tract in pregnancy, second trimester: Principal | ICD-10-CM

## 2017-01-16 MED ORDER — METRONIDAZOLE 500 MG PO TABS: 2000.0000 mg | ORAL_TABLET | Freq: Once | ORAL | 0 refills | Status: AC

## 2017-01-16 MED ORDER — ONDANSETRON HCL 8 MG PO TABS: 8.0000 mg | ORAL_TABLET | Freq: Once | ORAL | 0 refills | Status: AC

## 2017-01-16 MED ORDER — POLYETHYLENE GLYCOL 3350 17 G PO PACK: 17.0000 g | PACK | Freq: Every day | ORAL | 3 refills | Status: DC

## 2017-01-16 NOTE — Progress Notes (Signed)
Prenatal Visit Note Date: 01/16/2017 [facility]  Subjective:  Destiny Schneider is a 36 y.o. [phone] at 7617w5d being seen today for ongoing prenatal care.  She is currently monitored for the following issues for this high-risk pregnancy and has Trichomonal vaginitis during pregnancy in second trimester; History of preterm delivery; Short cervix during pregnancy in second trimester; Late prenatal care affecting pregnancy in second trimester; AMA (advanced maternal age) multigravida 35+, second trimester; Marijuana abuse; Anemia in pregnancy; and Supervision of high risk pregnancy, antepartum on her problem list.  Patient reports occasional UCs. constipation.   Contractions: Irritability. Vag. Bleeding: None.  Movement: Present. Denies leaking of fluid.   The following portions of the patient's history were reviewed and updated as appropriate: allergies, current medications, past family history, past medical history, past social history, past surgical history and problem list. Problem list updated.  Objective:   Vitals:   01/16/17 1552  BP: 123/67  Pulse: (!) 101  Weight: 134 lb 9.6 oz (61.1 kg)    Fetal Status: Fetal Heart Rate (bpm): 145 Fundal Height: 27 cm Movement: Present     General:  Alert, oriented and cooperative. Patient is in no acute distress.  Skin: Skin is warm and dry. No rash noted.   Cardiovascular: Normal heart rate noted  Respiratory: Normal respiratory effort, no problems with respiration noted  Abdomen: Soft, gravid, appropriate for gestational age. Pain/Pressure: Present     Pelvic:  Cervical exam deferred        Extremities: Normal range of motion.  Edema: None  Mental Status: Normal mood and affect. Normal behavior. Normal judgment and thought content.   Urinalysis:      Assessment and Plan:  Pregnancy: [id number] at 3017w5d  1. Trichomonal vaginitis during pregnancy in second trimester Threw up Rx last time. zofran pre med sent  in with rpt flagyl. Pt told to call if throws up again  2. Anemia during pregnancy in third trimester Pt on bid ferrous sulfate 325. miralax qday for constipation  3 Short cervix during pregnancy in second trimester Cervical pessary in plce  4. Supervision of high risk pregnancy, antepartum BTL papers already signed.  Preterm labor symptoms and general obstetric precautions including but not limited to vaginal bleeding, contractions, leaking of fluid and fetal movement were reviewed in detail with the patient. Please refer to After Visit Summary for other counseling recommendations.  Return in about 2 weeks (around 01/30/2017).   Sylva BingPickens, Maliya Marich, MD

## 2017-01-16 NOTE — Progress Notes (Signed)
c/o constipation

## 2017-01-29 ENCOUNTER — Inpatient Hospital Stay (HOSPITAL_COMMUNITY)
Admission: AD | Admit: 2017-01-29 | Discharge: 2017-01-29 | Disposition: A | Discharge status: Home / Self Care | Payer: Medicaid Other | Source: Ambulatory Visit | Attending: Obstetrics & Gynecology | Admitting: Obstetrics & Gynecology

## 2017-01-29 ENCOUNTER — Encounter (HOSPITAL_COMMUNITY): Payer: Self-pay | Admitting: *Deleted

## 2017-01-29 DIAGNOSIS — Z3A30 30 weeks gestation of pregnancy: Secondary | ICD-10-CM | POA: Insufficient documentation

## 2017-01-29 DIAGNOSIS — O4703 False labor before 37 completed weeks of gestation, third trimester: Secondary | ICD-10-CM | POA: Diagnosis not present

## 2017-01-29 DIAGNOSIS — A599 Trichomoniasis, unspecified: Secondary | ICD-10-CM | POA: Insufficient documentation

## 2017-01-29 DIAGNOSIS — O98313 Other infections with a predominantly sexual mode of transmission complicating pregnancy, third trimester: Secondary | ICD-10-CM | POA: Insufficient documentation

## 2017-01-29 DIAGNOSIS — O23592 Infection of other part of genital tract in pregnancy, second trimester: Secondary | ICD-10-CM

## 2017-01-29 DIAGNOSIS — A5901 Trichomonal vulvovaginitis: Secondary | ICD-10-CM

## 2017-01-29 DIAGNOSIS — O26872 Cervical shortening, second trimester: Secondary | ICD-10-CM

## 2017-01-29 DIAGNOSIS — Z8751 Personal history of pre-term labor: Secondary | ICD-10-CM

## 2017-01-29 HISTORY — DX: Anemia, unspecified: D64.9

## 2017-01-29 LAB — WET PREP, GENITAL
Sperm: NONE SEEN
Yeast Wet Prep HPF POC: NONE SEEN

## 2017-01-29 LAB — URINALYSIS, ROUTINE W REFLEX MICROSCOPIC
BILIRUBIN URINE: NEGATIVE
Glucose, UA: NEGATIVE mg/dL
Hgb urine dipstick: NEGATIVE
KETONES UR: NEGATIVE mg/dL
Nitrite: NEGATIVE
PROTEIN: NEGATIVE mg/dL
Specific Gravity, Urine: 1.009 (ref 1.005–1.030)
pH: 7 (ref 5.0–8.0)

## 2017-01-29 MED ORDER — TINIDAZOLE 500 MG PO TABS: 2.0000 g | ORAL_TABLET | Freq: Once | ORAL | 0 refills | Status: AC

## 2017-01-29 NOTE — Discharge Instructions (Signed)
Keep your appointment in the office this week. No intercourse until the pessary is removed at 37 weeks. Return if you continue to have worsening symptoms or begin having vaginal bleeding. Get your medication and take as directed.

## 2017-01-29 NOTE — MAU Note (Signed)
Pt reports contractions on and off for the past week, but tonight got a lot stronger. States she has about 6 contractions in 1 hour. Pt denies LOF or vaginal bleeding. Reports good fetal movement. Pt has a hx of 2 PTD and had a pessary placed in April for this pregnancy for shortened cervix.

## 2017-01-29 NOTE — MAU Provider Note (Signed)
History     CSN: [id number]  Arrival date and time: 01/29/17 1925   First Provider Initiated Contact with Patient 01/29/17 2007      Chief Complaint  Patient presents with  . Contractions   HPI Destiny Schneider 36 y.o. 4172w4d  Comes to MAU today with increasing contractions and pain with contractions when she is on the toilet urinating.  History of preterm births.  Had a 1.7 cm cervical length at 2250w4d and has a pessary in place.  (Was considered too late in pregnancy for cerclage).  Has been treated for trichomonas on May 7.  Has not had sex since then.   OB History    Gravida Para Term Preterm AB Living   3 1   1 1 1    SAB TAB Ectopic Multiple Live Births   1       1      Obstetric Comments   G1: PPROM and then stayed pregnant for about two weeks and then had delivery G2: patient states she had no PNC was 5 months and sounds like she had sudden sensation of having to push/low belly pressure. She came to the hospital here and had SVD with NN  death.       Past Medical History:  Diagnosis Date  . Anemia   . Hx of trichomoniasis 09/2016  . Preterm labor     Past Surgical History:  Procedure Laterality Date  . WISDOM TOOTH EXTRACTION      Family History  Problem Relation Age of Onset  . Varicose Veins Mother   . Alcohol abuse Father   . Drug abuse Father     Social History  Substance Use Topics  . Smoking status: Current Every Day Smoker    Packs/day: 0.50  . Smokeless tobacco: Former NeurosurgeonUser  . Alcohol use 0.6 oz/week    1 Shots of liquor per week    Allergies: No Known Allergies  Prescriptions Prior to Admission  Medication Sig Dispense Refill Last Dose  . acetaminophen (TYLENOL) 500 MG tablet Take 500 mg by mouth every 6 (six) hours as needed for mild pain.   Taking  . albuterol (PROVENTIL HFA;VENTOLIN HFA) 108 (90 BASE) MCG/ACT inhaler Inhale 1-2 puffs into the lungs every 6 (six) hours as needed for wheezing or shortness of breath.   Taking  .  ferrous sulfate 325 (65 FE) MG EC tablet Take 325 mg by mouth 2 (two) times daily after a meal.   Taking  . polyethylene glycol (MIRALAX / GLYCOLAX) packet Take 17 g by mouth daily. 30 each 3     Review of Systems  Constitutional: Negative for fever.  Gastrointestinal: Negative for nausea and vomiting.  Genitourinary: Positive for dysuria. Negative for vaginal bleeding.       Thinking she has contractions when she is on the toilet.   Physical Exam   Blood pressure 127/64, pulse 95, temperature 98.4 F (36.9 C), temperature source Oral, resp. rate 18, height 5\' 7"  (1.702 m), weight 136 lb (61.7 kg), last menstrual period 08/03/2016, SpO2 100 %.  Physical Exam  Nursing note and vitals reviewed. Constitutional: She is oriented to person, place, and time. She appears well-developed and well-nourished.  HENT:  Head: Normocephalic.  Eyes: EOM are normal.  Neck: Neck supple.  GI: Soft. There is no tenderness. There is no rebound and no guarding.  Occasional contraction noted that client does not feel. FHT 135 with moderate variability and 15x15 accels noted - reactive strip. One  variable decel noted which would be considered normal.  Genitourinary:  Genitourinary Comments: Dorathy Kinsman, CNM performed speculum exam and cervix is visually closed.  Pessary is in vagina.  Vaginal discharge is malodorous so wet prep done.  Musculoskeletal: Normal range of motion.  Neurological: She is alert and oriented to person, place, and time.  Skin: Skin is warm and dry.  Psychiatric: She has a normal mood and affect.    MAU Course  Procedures Results for orders placed or performed during the hospital encounter of 01/29/17 (from the past 24 hour(s))  Urinalysis, Routine w reflex microscopic     Status: Abnormal   Collection Time: 01/29/17  8:10 PM  Result Value Ref Range   Color, Urine YELLOW YELLOW   APPearance CLEAR CLEAR   Specific Gravity, Urine 1.009 1.005 - 1.030   pH 7.0 5.0 - 8.0    Glucose, UA NEGATIVE NEGATIVE mg/dL   Hgb urine dipstick NEGATIVE NEGATIVE   Bilirubin Urine NEGATIVE NEGATIVE   Ketones, ur NEGATIVE NEGATIVE mg/dL   Protein, ur NEGATIVE NEGATIVE mg/dL   Nitrite NEGATIVE NEGATIVE   Leukocytes, UA MODERATE (A) NEGATIVE   RBC / HPF 0-5 0 - 5 RBC/hpf   WBC, UA 0-5 0 - 5 WBC/hpf   Bacteria, UA RARE (A) NONE SEEN   Squamous Epithelial / LPF 0-5 (A) NONE SEEN   Renal Epithelial <1    Mucous PRESENT   Wet prep, genital     Status: Abnormal   Collection Time: 01/29/17  9:29 PM  Result Value Ref Range   Yeast Wet Prep HPF POC NONE SEEN NONE SEEN   Trich, Wet Prep PRESENT (A) NONE SEEN   Clue Cells Wet Prep HPF POC PRESENT (A) NONE SEEN   WBC, Wet Prep HPF POC MODERATE (A) NONE SEEN   Sperm NONE SEEN     MDM Advised no sex with this pregnancy until 37 weeks. Still has trich on wet prep.  Client has not had sex since last treatment.  Possibly harder to clear with the pessary in place - will try tindamax to see if client can better tolerate this medication.  Assessment and Plan  Threatened preterm labor Trichomonas  Plan Medication sent to her pharmacy for trich Keep your appointments in the office - next on the 29th No intercourse until the pessary is removed Return if your symptoms worsen. Or if you have vaginal bleeding.  Terri L Burleson 01/29/2017, 8:20 PM

## 2017-02-06 ENCOUNTER — Inpatient Hospital Stay (EMERGENCY_DEPARTMENT_HOSPITAL)
Admission: AD | Admit: 2017-02-06 | Discharge: 2017-02-06 | Disposition: A | Discharge status: Home / Self Care | Payer: Medicaid Other | Source: Ambulatory Visit | Attending: Obstetrics and Gynecology | Admitting: Obstetrics and Gynecology

## 2017-02-06 ENCOUNTER — Encounter (HOSPITAL_COMMUNITY): Payer: Self-pay | Admitting: *Deleted

## 2017-02-06 ENCOUNTER — Inpatient Hospital Stay (HOSPITAL_COMMUNITY): Payer: Medicaid Other

## 2017-02-06 DIAGNOSIS — O99333 Smoking (tobacco) complicating pregnancy, third trimester: Secondary | ICD-10-CM | POA: Insufficient documentation

## 2017-02-06 DIAGNOSIS — O26873 Cervical shortening, third trimester: Secondary | ICD-10-CM | POA: Insufficient documentation

## 2017-02-06 DIAGNOSIS — O4703 False labor before 37 completed weeks of gestation, third trimester: Secondary | ICD-10-CM | POA: Diagnosis not present

## 2017-02-06 DIAGNOSIS — Z3A31 31 weeks gestation of pregnancy: Secondary | ICD-10-CM

## 2017-02-06 DIAGNOSIS — F17210 Nicotine dependence, cigarettes, uncomplicated: Secondary | ICD-10-CM

## 2017-02-06 DIAGNOSIS — Z6710 Type A blood, Rh positive: Secondary | ICD-10-CM

## 2017-02-06 DIAGNOSIS — O09523 Supervision of elderly multigravida, third trimester: Secondary | ICD-10-CM

## 2017-02-06 DIAGNOSIS — A599 Trichomoniasis, unspecified: Secondary | ICD-10-CM

## 2017-02-06 DIAGNOSIS — R109 Unspecified abdominal pain: Secondary | ICD-10-CM | POA: Insufficient documentation

## 2017-02-06 DIAGNOSIS — O26893 Other specified pregnancy related conditions, third trimester: Secondary | ICD-10-CM

## 2017-02-06 DIAGNOSIS — O4693 Antepartum hemorrhage, unspecified, third trimester: Secondary | ICD-10-CM | POA: Insufficient documentation

## 2017-02-06 DIAGNOSIS — O479 False labor, unspecified: Secondary | ICD-10-CM

## 2017-02-06 DIAGNOSIS — Z79899 Other long term (current) drug therapy: Secondary | ICD-10-CM

## 2017-02-06 DIAGNOSIS — O4700 False labor before 37 completed weeks of gestation, unspecified trimester: Secondary | ICD-10-CM

## 2017-02-06 LAB — URINALYSIS, ROUTINE W REFLEX MICROSCOPIC
Bilirubin Urine: NEGATIVE
Glucose, UA: NEGATIVE mg/dL
Hgb urine dipstick: NEGATIVE
Ketones, ur: 15 mg/dL — AB
NITRITE: NEGATIVE
PH: 7 (ref 5.0–8.0)
Protein, ur: NEGATIVE mg/dL
Specific Gravity, Urine: 1.015 (ref 1.005–1.030)

## 2017-02-06 LAB — URINALYSIS, MICROSCOPIC (REFLEX)

## 2017-02-06 LAB — OB RESULTS CONSOLE GC/CHLAMYDIA: GC PROBE AMP, GENITAL: NEGATIVE

## 2017-02-06 LAB — POCT FERN TEST: POCT FERN TEST: NEGATIVE

## 2017-02-06 MED ORDER — NIFEDIPINE 10 MG PO CAPS
20.0000 mg | ORAL_CAPSULE | Freq: Once | ORAL | Status: AC
  Administered 2017-02-06: 20 mg via ORAL
  Filled 2017-02-06: qty 2

## 2017-02-06 MED ORDER — FENTANYL CITRATE (PF) 100 MCG/2ML IJ SOLN
50.0000 ug | Freq: Once | INTRAMUSCULAR | Status: AC
Start: 2017-02-06 — End: 2017-02-06
  Administered 2017-02-06: 50 ug via INTRAMUSCULAR
  Filled 2017-02-06: qty 2

## 2017-02-06 MED ORDER — TERBUTALINE SULFATE 1 MG/ML IJ SOLN
0.2500 mg | Freq: Once | INTRAMUSCULAR | Status: AC
  Administered 2017-02-06: 0.25 mg via SUBCUTANEOUS
  Filled 2017-02-06: qty 1

## 2017-02-06 MED ORDER — NIFEDIPINE 10 MG PO CAPS
10.0000 mg | ORAL_CAPSULE | Freq: Once | ORAL | Status: AC
  Administered 2017-02-06: 10 mg via ORAL
  Filled 2017-02-06: qty 1

## 2017-02-06 MED ORDER — ONDANSETRON 8 MG PO TBDP
8.0000 mg | ORAL_TABLET | Freq: Once | ORAL | Status: AC
  Administered 2017-02-06: 8 mg via ORAL
  Filled 2017-02-06: qty 1

## 2017-02-06 MED ORDER — METRONIDAZOLE 500 MG PO TABS
2000.0000 mg | ORAL_TABLET | Freq: Once | ORAL | Status: AC
  Administered 2017-02-06: 2000 mg via ORAL
  Filled 2017-02-06: qty 4

## 2017-02-06 NOTE — MAU Provider Note (Signed)
History     CSN: [id number]  Arrival date and time: 02/06/17 1338   First Provider Initiated Contact with Patient 02/06/17 1358      Chief Complaint  Patient presents with  . Contractions  . Vaginal Bleeding   HPI    [name] is a 36 y.o. female [phone] is here in MAU with worsening contractions. Says she was here on 5/20 with contractions and says that her contractions have gotten stronger. She currently has a cervical pessary in place for shortened cervix. She has a history of a 31 week delivery and a 20 week delivery. She rates her contractions 10/10 and says that she is having bleeding. The bleeding is brown in color and is enough that she see's it on her pad. She denies recently intercourse.   Patient was + for trichomonas on 5/20; she vomited her flagyl.  OB History    Gravida Para Term Preterm AB Living   3 1   1 1 1    SAB TAB Ectopic Multiple Live Births   1       1      Obstetric Comments   G1: PPROM and then stayed pregnant for about two weeks and then had delivery G2: patient states she had no PNC was 5 months and sounds like she had sudden sensation of having to push/low belly pressure. She came to the hospital here and had SVD with NN  death.       Past Medical History:  Diagnosis Date  . Anemia   . Hx of trichomoniasis 09/2016  . Preterm labor     Past Surgical History:  Procedure Laterality Date  . WISDOM TOOTH EXTRACTION      Family History  Problem Relation Age of Onset  . Varicose Veins Mother   . Alcohol abuse Father   . Drug abuse Father     Social History  Substance Use Topics  . Smoking status: Current Every Day Smoker    Packs/day: 0.50  . Smokeless tobacco: Former Neurosurgeon  . Alcohol use 0.6 oz/week    1 Shots of liquor per week    Allergies: No Known Allergies  Prescriptions Prior to Admission  Medication Sig Dispense Refill Last Dose  . acetaminophen (TYLENOL) 500 MG tablet Take 500 mg by mouth every 6 (six) hours  as needed for mild pain.   Taking  . albuterol (PROVENTIL HFA;VENTOLIN HFA) 108 (90 BASE) MCG/ACT inhaler Inhale 1-2 puffs into the lungs every 6 (six) hours as needed for wheezing or shortness of breath.   Taking  . ferrous sulfate 325 (65 FE) MG EC tablet Take 325 mg by mouth 2 (two) times daily after a meal.   Taking  . polyethylene glycol (MIRALAX / GLYCOLAX) packet Take 17 g by mouth daily. 30 each 3    Results for orders placed or performed during the hospital encounter of 02/06/17 (from the past 48 hour(s))  Urinalysis, Routine w reflex microscopic     Status: Abnormal   Collection Time: 02/06/17  1:46 PM  Result Value Ref Range   Color, Urine YELLOW YELLOW   APPearance CLEAR CLEAR   Specific Gravity, Urine 1.015 1.005 - 1.030   pH 7.0 5.0 - 8.0   Glucose, UA NEGATIVE NEGATIVE mg/dL   Hgb urine dipstick NEGATIVE NEGATIVE   Bilirubin Urine NEGATIVE NEGATIVE   Ketones, ur 15 (A) NEGATIVE mg/dL   Protein, ur NEGATIVE NEGATIVE mg/dL   Nitrite NEGATIVE NEGATIVE   Leukocytes, UA SMALL (A)  NEGATIVE  Urinalysis, Microscopic (reflex)     Status: Abnormal   Collection Time: 02/06/17  1:46 PM  Result Value Ref Range   RBC / HPF 0-5 0 - 5 RBC/hpf   WBC, UA 0-5 0 - 5 WBC/hpf   Bacteria, UA FEW (A) NONE SEEN   Squamous Epithelial / LPF 0-5 (A) NONE SEEN  Fern Test     Status: None   Collection Time: 02/06/17  5:05 PM  Result Value Ref Range   POCT Fern Test Negative = intact amniotic membranes    . Review of Systems  Gastrointestinal: Positive for abdominal pain and nausea.  Genitourinary: Positive for vaginal bleeding. Negative for dysuria.   Physical Exam   Blood pressure 115/66, pulse (!) 102, temperature 97.8 F (36.6 C), temperature source Oral, resp. rate 16, weight 139 lb 1.3 oz (63.1 kg), last menstrual period 08/03/2016, SpO2 100 %.  Physical Exam  Constitutional: She is oriented to person, place, and time. She appears well-developed and well-nourished. No distress.   HENT:  Head: Normocephalic.  Eyes: Pupils are equal, round, and reactive to light.  Genitourinary:  Genitourinary Comments: Cervix visually closed, thick Pooling of brown fluid noted + strong odor Fern negative Wet prep collected   Musculoskeletal: Normal range of motion.  Neurological: She is alert and oriented to person, place, and time.  Skin: Skin is warm. She is not diaphoretic.  Psychiatric: Her behavior is normal.   Fetal Tracing: Baseline: 130 Variability: Moderate  Accelerations: 15x15 Decelerations: None Toco: Q3-8, irregular pattern    MAU Course  Procedures  None  MDM  A positive blood type  Fentanyl 50 mcg IM X 1 Procardia 20 mg PO X1 Zofran 8 mg ODT Flagyl 2 grams given PO ; patient tolerated  Patient continues to rate her contraction pain 10/10. Brown blood noted on patients bad, soft ball size.  Dr. Vergie LivingPickens notified of bleeding and vaginal exam. Dr. Vergie LivingPickens to come to MAU to assess the patient. See Dr. Vergie LivingPickens cervical exam.  Terbutaline 0.25 sub Q per Dr. Vergie LivingPickens Patient says contractions have improved some, she rates her pain 6/10 down from 10/10. Continues to see some blood when wiping, still brown in color Dr. Vergie LivingPickens notified @ 1930: Dr. Vergie LivingPickens to come to MAU to discuss plan of care with the patient.   Assessment and Plan

## 2017-02-06 NOTE — Progress Notes (Addendum)
MAU Note  Came in for worsening UCs (worse than when came in last time for evaluation). Feels them every few minutes and noticed some blood streaks in d/c today at work, too; pt has had persistent trich infection and states she threw up the flagyl when she was here last time. With zofran odt, pt states she was able to keep it down this time.   NAD Category I with accels +irritability. Hard to see definite UCs but ?q3-1356m Pessary in place with cx sitting above it. 1/long/high/intermediate/mid position. Pessary moved back into position HR: 80s-90s. Normal s1 and s2, no MRGs CTAB  Pt has received dose of procardia 20 x 1 at 1418 and fentanyl 50 x 1 at 1550 already.  Will do a dose of terbutaline. If much improved, then can go home, but if s/s persist, will give BMZ course, recheck SVE and if unchanged will do, consider morphine sleep. If changes then will admit and do Mg. Pt amenable to plan.   Destiny Schneider, Jr MD [facility]ending [facility] 02/06/2017 Time: [phone]

## 2017-02-06 NOTE — MAU Note (Signed)
+  contractions--been off and on since last week but has gotten worse today Rating pain 10/10 +vaginal bleeding--spotting on panty liner--started today Has an appt tomorrow but could not wait

## 2017-02-06 NOTE — Discharge Instructions (Signed)
Preterm Labor and Birth Information °Pregnancy normally lasts 39-41 weeks. Preterm labor is when labor starts early. It starts before you have been pregnant for 37 whole weeks. °What are the risk factors for preterm labor? °Preterm labor is more likely to occur in women who: °· Have an infection while pregnant. °· Have a cervix that is short. °· Have gone into preterm labor before. °· Have had surgery on their cervix. °· Are younger than age 37. °· Are older than age 35. °· Are African American. °· Are pregnant with two or more babies. °· Take street drugs while pregnant. °· Smoke while pregnant. °· Do not gain enough weight while pregnant. °· Got pregnant right after another pregnancy. °What are the symptoms of preterm labor? °Symptoms of preterm labor include: °· Cramps. The cramps may feel like the cramps some women get during their period. The cramps may happen with watery poop (diarrhea). °· Pain in the belly (abdomen). °· Pain in the lower back. °· Regular contractions or tightening. It may feel like your belly is getting tighter. °· Pressure in the lower belly that seems to get stronger. °· More fluid (discharge) leaking from the vagina. The fluid may be watery or bloody. °· Water breaking. °Why is it important to notice signs of preterm labor? °Babies who are born early may not be fully developed. They have a higher chance for: °· Long-term heart problems. °· Long-term lung problems. °· Trouble controlling body systems, like breathing. °· Bleeding in the brain. °· A condition called cerebral palsy. °· Learning difficulties. °· Death. °These risks are highest for babies who are born before 34 weeks of pregnancy. °How is preterm labor treated? °Treatment depends on: °· How long you were pregnant. °· Your condition. °· The health of your baby. °Treatment may involve: °· Having a stitch (suture) placed in your cervix. When you give birth, your cervix opens so the baby can come out. The stitch keeps the cervix  from opening too soon. °· Staying at the hospital. °· Taking or getting medicines, such as: °¨ Hormone medicines. °¨ Medicines to stop contractions. °¨ Medicines to help the baby’s lungs develop. °¨ Medicines to prevent your baby from having cerebral palsy. °What should I do if I am in preterm labor? °If you think you are going into labor too soon, call your doctor right away. °How can I prevent preterm labor? °· Do not use any tobacco products. °¨ Examples of these are cigarettes, chewing tobacco, and e-cigarettes. °¨ If you need help quitting, ask your doctor. °· Do not use street drugs. °· Do not use any medicines unless you ask your doctor if they are safe for you. °· Talk with your doctor before taking any herbal supplements. °· Make sure you gain enough weight. °· Watch for infection. If you think you might have an infection, get it checked right away. °· If you have gone into preterm labor before, tell your doctor. °This information is not intended to replace advice given to you by your health care provider. Make sure you discuss any questions you have with your health care provider. °Document Released: 11/25/2008 Document Revised: 02/09/2016 Document Reviewed: 01/20/2016 °Elsevier Interactive Patient Education © 2017 [employer] ° °

## 2017-02-07 ENCOUNTER — Encounter (HOSPITAL_COMMUNITY): Payer: Self-pay | Admitting: *Deleted

## 2017-02-07 ENCOUNTER — Inpatient Hospital Stay (HOSPITAL_COMMUNITY)
Admission: AD | Admit: 2017-02-07 | Discharge: 2017-02-13 | DRG: 765 | Disposition: A | Discharge status: Home / Self Care | Payer: Medicaid Other | Source: Ambulatory Visit | Attending: Obstetrics and Gynecology | Admitting: Obstetrics and Gynecology

## 2017-02-07 ENCOUNTER — Encounter: Payer: Medicaid Other | Admitting: Obstetrics and Gynecology

## 2017-02-07 DIAGNOSIS — O26873 Cervical shortening, third trimester: Secondary | ICD-10-CM | POA: Diagnosis present

## 2017-02-07 DIAGNOSIS — D649 Anemia, unspecified: Secondary | ICD-10-CM | POA: Diagnosis present

## 2017-02-07 DIAGNOSIS — O9832 Other infections with a predominantly sexual mode of transmission complicating childbirth: Secondary | ICD-10-CM | POA: Diagnosis present

## 2017-02-07 DIAGNOSIS — O26872 Cervical shortening, second trimester: Secondary | ICD-10-CM

## 2017-02-07 DIAGNOSIS — O9902 Anemia complicating childbirth: Secondary | ICD-10-CM | POA: Diagnosis present

## 2017-02-07 DIAGNOSIS — O42919 Preterm premature rupture of membranes, unspecified as to length of time between rupture and onset of labor, unspecified trimester: Secondary | ICD-10-CM

## 2017-02-07 DIAGNOSIS — O0932 Supervision of pregnancy with insufficient antenatal care, second trimester: Secondary | ICD-10-CM

## 2017-02-07 DIAGNOSIS — Z3A31 31 weeks gestation of pregnancy: Secondary | ICD-10-CM

## 2017-02-07 DIAGNOSIS — O4703 False labor before 37 completed weeks of gestation, third trimester: Secondary | ICD-10-CM

## 2017-02-07 DIAGNOSIS — F17210 Nicotine dependence, cigarettes, uncomplicated: Secondary | ICD-10-CM | POA: Diagnosis present

## 2017-02-07 DIAGNOSIS — O3433 Maternal care for cervical incompetence, third trimester: Secondary | ICD-10-CM | POA: Diagnosis present

## 2017-02-07 DIAGNOSIS — O42913 Preterm premature rupture of membranes, unspecified as to length of time between rupture and onset of labor, third trimester: Secondary | ICD-10-CM | POA: Diagnosis present

## 2017-02-07 DIAGNOSIS — O99013 Anemia complicating pregnancy, third trimester: Secondary | ICD-10-CM | POA: Diagnosis not present

## 2017-02-07 DIAGNOSIS — O23593 Infection of other part of genital tract in pregnancy, third trimester: Secondary | ICD-10-CM

## 2017-02-07 DIAGNOSIS — O322XX0 Maternal care for transverse and oblique lie, not applicable or unspecified: Secondary | ICD-10-CM | POA: Diagnosis present

## 2017-02-07 DIAGNOSIS — O320XX0 Maternal care for unstable lie, not applicable or unspecified: Secondary | ICD-10-CM | POA: Diagnosis present

## 2017-02-07 DIAGNOSIS — F1210 Cannabis abuse, uncomplicated: Secondary | ICD-10-CM | POA: Diagnosis present

## 2017-02-07 DIAGNOSIS — O0990 Supervision of high risk pregnancy, unspecified, unspecified trimester: Secondary | ICD-10-CM

## 2017-02-07 DIAGNOSIS — A5901 Trichomonal vulvovaginitis: Secondary | ICD-10-CM | POA: Diagnosis present

## 2017-02-07 DIAGNOSIS — O99334 Smoking (tobacco) complicating childbirth: Secondary | ICD-10-CM | POA: Diagnosis present

## 2017-02-07 DIAGNOSIS — O23592 Infection of other part of genital tract in pregnancy, second trimester: Secondary | ICD-10-CM

## 2017-02-07 DIAGNOSIS — Z8751 Personal history of pre-term labor: Secondary | ICD-10-CM

## 2017-02-07 DIAGNOSIS — Z3A32 32 weeks gestation of pregnancy: Secondary | ICD-10-CM | POA: Diagnosis not present

## 2017-02-07 DIAGNOSIS — Z98891 History of uterine scar from previous surgery: Secondary | ICD-10-CM

## 2017-02-07 DIAGNOSIS — O42113 Preterm premature rupture of membranes, onset of labor more than 24 hours following rupture, third trimester: Secondary | ICD-10-CM | POA: Diagnosis not present

## 2017-02-07 DIAGNOSIS — O99324 Drug use complicating childbirth: Secondary | ICD-10-CM | POA: Diagnosis present

## 2017-02-07 DIAGNOSIS — O09522 Supervision of elderly multigravida, second trimester: Secondary | ICD-10-CM

## 2017-02-07 LAB — ABO/RH: ABO/RH(D): A POS

## 2017-02-07 LAB — CBC
HEMATOCRIT: 35.3 % — AB (ref 36.0–46.0)
Hemoglobin: 11.9 g/dL — ABNORMAL LOW (ref 12.0–15.0)
MCH: 33 pg (ref 26.0–34.0)
MCHC: 33.7 g/dL (ref 30.0–36.0)
MCV: 97.8 fL (ref 78.0–100.0)
Platelets: 255 10*3/uL (ref 150–400)
RBC: 3.61 MIL/uL — AB (ref 3.87–5.11)
RDW: 14.3 % (ref 11.5–15.5)
WBC: 9.7 10*3/uL (ref 4.0–10.5)

## 2017-02-07 LAB — RAPID HIV SCREEN (HIV 1/2 AB+AG)
HIV 1/2 ANTIBODIES: NONREACTIVE
HIV-1 P24 Antigen - HIV24: NONREACTIVE

## 2017-02-07 LAB — TYPE AND SCREEN
ABO/RH(D): A POS
ANTIBODY SCREEN: NEGATIVE

## 2017-02-07 LAB — GC/CHLAMYDIA PROBE AMP (~~LOC~~) NOT AT ARMC
CHLAMYDIA, DNA PROBE: NEGATIVE
NEISSERIA GONORRHEA: NEGATIVE

## 2017-02-07 MED ORDER — OXYTOCIN 40 UNITS IN LACTATED RINGERS INFUSION - SIMPLE MED: 2.5000 [IU]/h | INTRAVENOUS | Status: DC

## 2017-02-07 MED ORDER — PENICILLIN G POTASSIUM 5000000 UNITS IJ SOLR
5.0000 10*6.[IU] | Freq: Once | INTRAVENOUS | Status: AC
  Administered 2017-02-07: 5 10*6.[IU] via INTRAVENOUS
  Filled 2017-02-07: qty 5

## 2017-02-07 MED ORDER — PENICILLIN G POT IN DEXTROSE 60000 UNIT/ML IV SOLN
3.0000 10*6.[IU] | INTRAVENOUS | Status: DC
  Administered 2017-02-07: 3 10*6.[IU] via INTRAVENOUS
  Filled 2017-02-07 (×5): qty 50

## 2017-02-07 MED ORDER — SOD CITRATE-CITRIC ACID 500-334 MG/5ML PO SOLN: 30.0000 mL | ORAL | Status: DC | PRN

## 2017-02-07 MED ORDER — MAGNESIUM SULFATE 40 G IN LACTATED RINGERS - SIMPLE
2.0000 g/h | INTRAVENOUS | Status: AC
  Administered 2017-02-08: 2 g/h via INTRAVENOUS
  Filled 2017-02-07: qty 500
  Filled 2017-02-07: qty 40

## 2017-02-07 MED ORDER — ZOLPIDEM TARTRATE 5 MG PO TABS
5.0000 mg | ORAL_TABLET | Freq: Every evening | ORAL | Status: DC | PRN
  Administered 2017-02-08: 5 mg via ORAL
  Filled 2017-02-07: qty 1

## 2017-02-07 MED ORDER — ONDANSETRON HCL 4 MG/2ML IJ SOLN: 4.0000 mg | Freq: Four times a day (QID) | INTRAMUSCULAR | Status: DC | PRN

## 2017-02-07 MED ORDER — FERROUS SULFATE 325 (65 FE) MG PO TBEC: 325.0000 mg | DELAYED_RELEASE_TABLET | Freq: Two times a day (BID) | ORAL | Status: DC

## 2017-02-07 MED ORDER — ALBUTEROL SULFATE (2.5 MG/3ML) 0.083% IN NEBU: 2.5000 mg | INHALATION_SOLUTION | Freq: Four times a day (QID) | RESPIRATORY_TRACT | Status: DC | PRN

## 2017-02-07 MED ORDER — LACTATED RINGERS IV BOLUS (SEPSIS)
1000.0000 mL | Freq: Once | INTRAVENOUS | Status: AC
  Administered 2017-02-07: 1000 mL via INTRAVENOUS

## 2017-02-07 MED ORDER — METRONIDAZOLE 500 MG PO TABS
2000.0000 mg | ORAL_TABLET | Freq: Once | ORAL | Status: AC
  Administered 2017-02-07: 2000 mg via ORAL
  Filled 2017-02-07: qty 4

## 2017-02-07 MED ORDER — ALBUTEROL SULFATE HFA 108 (90 BASE) MCG/ACT IN AERS: 1.0000 | INHALATION_SPRAY | Freq: Four times a day (QID) | RESPIRATORY_TRACT | Status: DC | PRN

## 2017-02-07 MED ORDER — NIFEDIPINE 10 MG PO CAPS
10.0000 mg | ORAL_CAPSULE | ORAL | Status: AC
  Administered 2017-02-07 (×3): 10 mg via ORAL
  Filled 2017-02-07 (×3): qty 1

## 2017-02-07 MED ORDER — FERROUS SULFATE 325 (65 FE) MG PO TABS
325.0000 mg | ORAL_TABLET | Freq: Two times a day (BID) | ORAL | Status: DC
  Administered 2017-02-08 – 2017-02-10 (×6): 325 mg via ORAL
  Filled 2017-02-07 (×6): qty 1

## 2017-02-07 MED ORDER — LACTATED RINGERS IV SOLN
INTRAVENOUS | Status: DC
  Administered 2017-02-07: 17:00:00 via INTRAVENOUS

## 2017-02-07 MED ORDER — OXYCODONE-ACETAMINOPHEN 5-325 MG PO TABS: 2.0000 | ORAL_TABLET | ORAL | Status: DC | PRN

## 2017-02-07 MED ORDER — OXYTOCIN BOLUS FROM INFUSION: 500.0000 mL | Freq: Once | INTRAVENOUS | Status: DC

## 2017-02-07 MED ORDER — BETAMETHASONE SOD PHOS & ACET 6 (3-3) MG/ML IJ SUSP
12.0000 mg | INTRAMUSCULAR | Status: AC
  Administered 2017-02-07 – 2017-02-08 (×2): 12 mg via INTRAMUSCULAR
  Filled 2017-02-07 (×2): qty 2

## 2017-02-07 MED ORDER — ACETAMINOPHEN 325 MG PO TABS
650.0000 mg | ORAL_TABLET | ORAL | Status: DC | PRN
  Administered 2017-02-08: 650 mg via ORAL
  Filled 2017-02-07: qty 2

## 2017-02-07 MED ORDER — OXYCODONE-ACETAMINOPHEN 5-325 MG PO TABS: 1.0000 | ORAL_TABLET | ORAL | Status: DC | PRN

## 2017-02-07 MED ORDER — PRENATAL MULTIVITAMIN CH
1.0000 | ORAL_TABLET | Freq: Every day | ORAL | Status: DC
  Administered 2017-02-08 – 2017-02-10 (×3): 1 via ORAL
  Filled 2017-02-07 (×3): qty 1

## 2017-02-07 MED ORDER — MAGNESIUM SULFATE BOLUS VIA INFUSION
4.0000 g | Freq: Once | INTRAVENOUS | Status: AC
  Administered 2017-02-07: 4 g via INTRAVENOUS
  Filled 2017-02-07: qty 500

## 2017-02-07 MED ORDER — DOCUSATE SODIUM 100 MG PO CAPS
100.0000 mg | ORAL_CAPSULE | Freq: Every day | ORAL | Status: DC
  Administered 2017-02-08 – 2017-02-10 (×3): 100 mg via ORAL
  Filled 2017-02-07 (×3): qty 1

## 2017-02-07 MED ORDER — ACETAMINOPHEN 325 MG PO TABS: 650.0000 mg | ORAL_TABLET | ORAL | Status: DC | PRN

## 2017-02-07 MED ORDER — LACTATED RINGERS IV SOLN
INTRAVENOUS | Status: DC
  Administered 2017-02-07 – 2017-02-08 (×2): via INTRAVENOUS
  Administered 2017-02-08: 100 mL/h via INTRAVENOUS

## 2017-02-07 MED ORDER — LIDOCAINE HCL (PF) 1 % IJ SOLN
30.0000 mL | INTRAMUSCULAR | Status: DC | PRN
  Filled 2017-02-07: qty 30

## 2017-02-07 MED ORDER — CALCIUM CARBONATE ANTACID 500 MG PO CHEW: 2.0000 | CHEWABLE_TABLET | ORAL | Status: DC | PRN

## 2017-02-07 MED ORDER — LACTATED RINGERS IV SOLN: 500.0000 mL | INTRAVENOUS | Status: DC | PRN

## 2017-02-07 NOTE — H&P (Signed)
History   CSN: [id number]  Arrival date and time: 02/07/17 1143   First Provider Initiated Contact with Patient 02/07/17 1226      No chief complaint on file.  [id number] @31 .6 wks here with ctx. Ctx started around 8am and occurring q4-5 min. Denies VB or LOF. Reports good FM. Pregnancy complicated by PTD at 32 wks and 20 wks. She currently had pessary in place. Denies recent IC. She's been treated multiple time for trich during the pregnancy, last treated yesterday.           OB History    Gravida Para Term Preterm AB Living   3 1   1 1 1    SAB TAB Ectopic Multiple Live Births   1       1      Obstetric Comments   G1: PPROM and then stayed pregnant for about two weeks and then had delivery G2: patient states she had no PNC was 5 months and sounds like she had sudden sensation of having to push/low belly pressure. She came to the hospital here and had SVD with NN  death.           Past Medical History:  Diagnosis Date  . Anemia   . Hx of trichomoniasis 09/2016  . Preterm labor          Past Surgical History:  Procedure Laterality Date  . WISDOM TOOTH EXTRACTION           Family History  Problem Relation Age of Onset  . Varicose Veins Mother   . Alcohol abuse Father   . Drug abuse Father           Social History  Substance Use Topics  . Smoking status: Current Every Day Smoker    Packs/day: 0.50  . Smokeless tobacco: Former NeurosurgeonUser  . Alcohol use 0.6 oz/week     1 Shots of liquor per week     Allergies: No Known Allergies  Prescriptions Prior to Admission  Medication Sig Dispense Refill Last Dose  . albuterol (PROVENTIL HFA;VENTOLIN HFA) 108 (90 BASE) MCG/ACT inhaler Inhale 1-2 puffs into the lungs every 6 (six) hours as needed for wheezing or shortness of breath.   Rescue  . ferrous sulfate 325 (65 FE) MG EC tablet Take 325 mg by mouth 2 (two) times daily after a meal.   02/06/2017 at Unknown time  . polyethylene  glycol (MIRALAX / GLYCOLAX) packet Take 17 g by mouth daily. 30 each 3 02/05/2017 at Unknown time  . Prenatal Vit-Fe Fumarate-FA (PRENATAL MULTIVITAMIN) TABS tablet Take 1 tablet by mouth daily at 12 noon.   Past Month at Unknown time    Review of Systems  Constitutional: Negative for fever.  Gastrointestinal: Positive for abdominal pain.  Genitourinary: Negative for vaginal bleeding and vaginal discharge.   Physical Exam   Blood pressure 127/69, pulse 87, temperature 98 F (36.7 C), temperature source Oral, resp. rate 19, height 5\' 7"  (1.702 m), weight 63 kg (139 lb), last menstrual period 08/03/2016, SpO2 100 %.  Physical Exam  Constitutional: She is oriented to person, place, and time. She appears well-developed and well-nourished. No distress.  HENT:  Head: Normocephalic and atraumatic.  Neck: Normal range of motion.  Cardiovascular: Normal rate.   Respiratory: Effort normal.  GI: Soft. She exhibits no distension. There is no tenderness.  Musculoskeletal: Normal range of motion.  Neurological: She is alert and oriented to person, place, and time.  Skin: Skin is warm and dry.  Psychiatric: She has a normal mood and affect.   EFM: 140 bpm, mod variability, + accels, no decels Toco: 4-5, moderate  Dilation: 3 Effacement (%): 50 Exam by:: Bhambri,CNM  MAU Course  Procedures IVF 1 L Procardia  MDM  Cervical change in MAU. Will admit for preterm labor  Assessment and Plan  1. Threatened Preterm Labor  Admit to labor floor   Magnesium 4g/2g   BMZ given in MAU, second dose due 1345  PCN for GBS unknown 2. Trichomonas   Will retreat as pessary likely acting as nidus for the trichomonas 3. Short Cervix  I discussed the patient with Dr Claudean Severance of MFM - no need to replace pessary at this point 4. AMA  No additional testing of labs needed 5. Anemia  Check CBC   Levie Heritage, DO 02/07/2017 4:20 PM

## 2017-02-07 NOTE — MAU Note (Signed)
Pt reports she has had regular contractions since this am. Denies bleeding or ROM

## 2017-02-07 NOTE — Progress Notes (Signed)
   Destiny Schneider is a 36 y.o. [id number] at 5624w6d  admitted for threatened pre-term labor and cervical insufficiency.   Subjective: Patient coping well; only feeling occasional contractions. Feeling fine with MgSo4; no complaints of SOB or weakness.   Objective: Vitals:   02/07/17 1756 02/07/17 1901 02/07/17 2005 02/07/17 2058  BP: 109/60 97/83 (!) 104/58 (!) 108/55  Pulse: 88 (!) 104 97 100  Resp: 18 18 18 18   Temp:      TempSrc:      SpO2:      Weight:      Height:       Total I/O In: 767.5 [P.O.:270; I.V.:447.5; IV Piggyback:50] Out: 1150 [Urine:1150]  FHT:  FHR: 120 bpm, variability: moderate,  accelerations:  Present,  decelerations:  Absent UC:   infrequent SVE:   Dilation: 3 Effacement (%): 50 Exam by:: Bhambri,CNM   Labs: Lab Results  Component Value Date   WBC 9.7 02/07/2017   HGB 11.9 (L) 02/07/2017   HCT 35.3 (L) 02/07/2017   MCV 97.8 02/07/2017   PLT 255 02/07/2017    Assessment / Plan: Threatened pre-term labor; continue MgSo4 prophylactically and plan for second dose of BMZ on 02-08-2017.  Reg diet ok Nicu consult ordered  Labor: not currently laboring Fetal Wellbeing:  Category I Pain Control:  undecided Anticipated MOD:  NSVD  Destiny Schneider CNM 02/07/2017, 9:08 PM

## 2017-02-07 NOTE — MAU Provider Note (Signed)
History     CSN: [id number]  Arrival date and time: 02/07/17 1143   First Provider Initiated Contact with Patient 02/07/17 1226      No chief complaint on file.  [id number] @31 .6 wks here with ctx. Ctx started around 8am and occurring q4-5 min. Denies VB or LOF. Reports good FM. Pregnancy complicated by PTD at 32 wks and 20 wks. She currently had pessary in place. Denies recent IC. She's been treated multiple time for trich during the pregnancy, last treated yesterday.    OB History    Gravida Para Term Preterm AB Living   3 1   1 1 1    SAB TAB Ectopic Multiple Live Births   1       1      Obstetric Comments   G1: PPROM and then stayed pregnant for about two weeks and then had delivery G2: patient states she had no PNC was 5 months and sounds like she had sudden sensation of having to push/low belly pressure. She came to the hospital here and had SVD with NN  death.       Past Medical History:  Diagnosis Date  . Anemia   . Hx of trichomoniasis 09/2016  . Preterm labor     Past Surgical History:  Procedure Laterality Date  . WISDOM TOOTH EXTRACTION      Family History  Problem Relation Age of Onset  . Varicose Veins Mother   . Alcohol abuse Father   . Drug abuse Father     Social History  Substance Use Topics  . Smoking status: Current Every Day Smoker    Packs/day: 0.50  . Smokeless tobacco: Former Neurosurgeon  . Alcohol use 0.6 oz/week    1 Shots of liquor per week    Allergies: No Known Allergies  Prescriptions Prior to Admission  Medication Sig Dispense Refill Last Dose  . albuterol (PROVENTIL HFA;VENTOLIN HFA) 108 (90 BASE) MCG/ACT inhaler Inhale 1-2 puffs into the lungs every 6 (six) hours as needed for wheezing or shortness of breath.   Rescue  . ferrous sulfate 325 (65 FE) MG EC tablet Take 325 mg by mouth 2 (two) times daily after a meal.   02/06/2017 at Unknown time  . polyethylene glycol (MIRALAX / GLYCOLAX) packet Take 17 g by mouth daily. 30 each 3  02/05/2017 at Unknown time  . Prenatal Vit-Fe Fumarate-FA (PRENATAL MULTIVITAMIN) TABS tablet Take 1 tablet by mouth daily at 12 noon.   Past Month at Unknown time    Review of Systems  Constitutional: Negative for fever.  Gastrointestinal: Positive for abdominal pain.  Genitourinary: Negative for dysuria, frequency, urgency, vaginal bleeding and vaginal discharge.   Physical Exam   Blood pressure 127/69, pulse 87, temperature 98 F (36.7 C), temperature source Oral, resp. rate 19, height 5\' 7"  (1.702 m), weight 63 kg (139 lb), last menstrual period 08/03/2016, SpO2 100 %.  Physical Exam  Constitutional: She is oriented to person, place, and time. She appears well-developed and well-nourished. No distress.  HENT:  Head: Normocephalic and atraumatic.  Neck: Normal range of motion.  Cardiovascular: Normal rate.   Respiratory: Effort normal.  GI: Soft. She exhibits no distension. There is no tenderness.  Musculoskeletal: Normal range of motion.  Neurological: She is alert and oriented to person, place, and time.  Skin: Skin is warm and dry.  Psychiatric: She has a normal mood and affect.   EFM: 140 bpm, mod variability, + accels, no decels Toco: 4-5, moderate  MAU Course  Procedures IVF 1 L Procardia x3 doses  MDM Cervical change noted with recheck. Will plan for admit. Dr. Adrian BlackwaterStinson notified of presentation and clinical findings.  Assessment and Plan  31.6 weeks PTL Admit to BS Magnesium Sulfate  Donette LarryMelanie Harlon Kutner, CNM 02/07/2017, 12:38 PM

## 2017-02-07 NOTE — MAU Note (Signed)
Urine in lab 

## 2017-02-08 DIAGNOSIS — O4703 False labor before 37 completed weeks of gestation, third trimester: Secondary | ICD-10-CM | POA: Diagnosis not present

## 2017-02-08 LAB — AMNISURE RUPTURE OF MEMBRANE (ROM) NOT AT ARMC: AMNISURE: POSITIVE

## 2017-02-08 LAB — RPR: RPR: NONREACTIVE

## 2017-02-08 MED ORDER — AZITHROMYCIN 250 MG PO TABS
1000.0000 mg | ORAL_TABLET | Freq: Once | ORAL | Status: AC
  Administered 2017-02-08: 1000 mg via ORAL
  Filled 2017-02-08: qty 4

## 2017-02-08 MED ORDER — AMOXICILLIN 500 MG PO CAPS
500.0000 mg | ORAL_CAPSULE | Freq: Three times a day (TID) | ORAL | Status: DC
  Administered 2017-02-10: 500 mg via ORAL
  Filled 2017-02-08: qty 1

## 2017-02-08 MED ORDER — SODIUM CHLORIDE 0.9 % IV SOLN
2.0000 g | Freq: Four times a day (QID) | INTRAVENOUS | Status: AC
  Administered 2017-02-08 – 2017-02-10 (×8): 2 g via INTRAVENOUS
  Filled 2017-02-08 (×8): qty 2000

## 2017-02-08 NOTE — Progress Notes (Signed)
UR chart review completed.  

## 2017-02-08 NOTE — Progress Notes (Signed)
FACULTY PRACTICE ANTEPARTUM NOTE  Kaliopi CARLEE VONDERHAAR is a 36 y.o. [id number] at [redacted]w[redacted]d  who is admitted for Preterm labor.   Fetal presentation is cephalic. Length of Stay:  1  Days  Subjective: Patient feels improved. Good fetal movement. No contractions felt and none seen on monitor. Patient reports good fetal movement.   She reports no uterine contractions She reports no bleeding  She reports no loss of fluid per vagina.  Vitals:  Blood pressure 99/67, pulse 93, temperature 98 F (36.7 C), temperature source Oral, resp. rate 16, height 5\' 7"  (1.702 m), weight 139 lb (63 kg), last menstrual period 08/03/2016, SpO2 99 %. Physical Examination:  General appearance - alert, well appearing, and in no distress Chest - clear to auscultation, no wheezes, rales or rhonchi, symmetric air entry Heart - normal rate, regular rhythm, normal S1, S2, no murmurs, rubs, clicks or gallops Abdomen - soft, nontender, nondistended, no masses or organomegaly Fundal Height:  size equals dates Extremities: extremities normal, atraumatic, no cyanosis or edema and Homans sign is negative, no sign of DVT  Membranes:intact  Fetal Monitoring:  Baseline: 125 bpm, Variability: Good {> 6 bpm), Accelerations: Reactive and Decelerations: Absent  Labs:  Results for orders placed or performed during the hospital encounter of 02/07/17 (from the past 24 hour(s))  CBC   Collection Time: 02/07/17 12:55 PM  Result Value Ref Range   WBC 9.7 4.0 - 10.5 K/uL   RBC 3.61 (L) 3.87 - 5.11 MIL/uL   Hemoglobin 11.9 (L) 12.0 - 15.0 g/dL   HCT 45.4 (L) 09.8 - 11.9 %   MCV 97.8 78.0 - 100.0 fL   MCH 33.0 26.0 - 34.0 pg   MCHC 33.7 30.0 - 36.0 g/dL   RDW 14.7 82.9 - 56.2 %   Platelets 255 150 - 400 K/uL  RPR   Collection Time: 02/07/17 12:55 PM  Result Value Ref Range   RPR Ser Ql Non Reactive Non Reactive  Rapid HIV screen (HIV 1/2 Ab+Ag) ([facility] Only)   Collection Time: 02/07/17 12:55 PM  Result Value Ref Range   HIV-1 P24  Antigen - HIV24 NON REACTIVE NON REACTIVE   HIV 1/2 Antibodies NON REACTIVE NON REACTIVE   Interpretation (HIV Ag Ab)      A non reactive test result means that HIV 1 or HIV 2 antibodies and HIV 1 p24 antigen were not detected in the specimen.  Type and screen [facility]   Collection Time: 02/07/17 12:55 PM  Result Value Ref Range   ABO/RH(D) A POS    Antibody Screen NEG    Sample Expiration 02/10/2017   ABO/Rh   Collection Time: 02/07/17 12:55 PM  Result Value Ref Range   ABO/RH(D) A POS     Imaging Studies:       Medications:  Scheduled . betamethasone acetate-betamethasone sodium phosphate  12 mg Intramuscular Q24 Hr x 2  . docusate sodium  100 mg Oral Daily  . ferrous sulfate  325 mg Oral BID WC  . oxytocin 40 units in LR 1000 mL  500 mL Intravenous Once  . prenatal multivitamin  1 tablet Oral Q1200   I have reviewed the patient's current medications.  ASSESSMENT: Active Problems:   Threatened preterm labor, third trimester   [redacted] weeks gestation of pregnancy   PLAN: 1. Threatened Preterm Labor  Continue Magnesium  Second dose due 1345 2. Trichomonas   S/p flagyl  3. Short Cervix  I discussed the patient with Dr Claudean Severance  of MFM - no need to replace pessary at this point 4. AMA  No additional testing of labs needed 5. Anemia  Check CBC Continue routine antenatal care.   Levie HeritageStinson, Jacob J, DO 02/08/2017,6:35 AM

## 2017-02-08 NOTE — Progress Notes (Signed)
Called to evaluate patient with PROM. Patient reports feeling a large gush of warm fluid running down her leg. Her gown was wet and the chuck pad was soaked. Amniosure was collected and found to be positive. Patient was informed of theses results. She denies cramping/contractions. She reports good fetal movement  Past Medical History:  Diagnosis Date  . Anemia   . Hx of trichomoniasis 09/2016  . Preterm labor    Past Surgical History:  Procedure Laterality Date  . WISDOM TOOTH EXTRACTION     Family History  Problem Relation Age of Onset  . Varicose Veins Mother   . Alcohol abuse Father   . Drug abuse Father    Social History  Substance Use Topics  . Smoking status: Current Every Day Smoker    Packs/day: 0.50  . Smokeless tobacco: Former NeurosurgeonUser  . Alcohol use 0.6 oz/week    1 Shots of liquor per week   ROS See pertinent in HPI  Blood pressure (!) 104/59, pulse (!) 102, temperature 98 F (36.7 C), temperature source Oral, resp. rate 16, height 5\' 7"  (1.702 m), weight 139 lb (63 kg), last menstrual period 08/03/2016, SpO2 100 %. GENERAL: Well-developed, well-nourished female in no acute distress.  ABDOMEN: Soft, nontender, gravid PELVIC: grossly ruptured EXTREMITIES: No cyanosis, clubbing, or edema, 2+ distal pulses.  A/P 36 yo with short cervix and now PPROM - Latency antibiotics started - Patient informed of plan for delivery at 34 weeks pending no si/sx of chorio or labor prior to that - Patient completed second dose of steroid today - Continue close antepartum care

## 2017-02-09 DIAGNOSIS — O42919 Preterm premature rupture of membranes, unspecified as to length of time between rupture and onset of labor, unspecified trimester: Secondary | ICD-10-CM | POA: Diagnosis not present

## 2017-02-09 DIAGNOSIS — O4703 False labor before 37 completed weeks of gestation, third trimester: Secondary | ICD-10-CM | POA: Diagnosis not present

## 2017-02-09 NOTE — Progress Notes (Addendum)
FACULTY PRACTICE ANTEPARTUM(COMPREHENSIVE) NOTE  Destiny Schneider is a 36 y.o. [id number] at 5269w1d  who is admitted for rupture of membranes  5/31, cervical incompetence.   Fetal presentation is cephalic. Length of Stay:  2  Days  Subjective: Pt denies complaints Patient reports the fetal movement as active. Patient reports uterine contraction  activity as none. Patient reports  vaginal bleeding as none. Patient describes fluid per vagina as Clear.  Vitals:  Blood pressure 115/60, pulse 73, temperature 98.2 F (36.8 C), temperature source Oral, resp. rate 18, height 5\' 7"  (1.702 m), weight 63 kg (139 lb), last menstrual period 08/03/2016, SpO2 100 %. Physical Examination:  General appearance - alert, well appearing, and in no distress and oriented to person, place, and time Heart - normal rate and regular rhythm Abdomen - soft, nontender, nondistended Fundal Height:  size equals dates Cervical Exam: Not evaluated. . Extremities: extremities normal, atraumatic, no cyanosis or edema and Homans sign is negative, no sign of DVT with DTRs 2+ bilaterally Membranes:intact, ruptured, clear fluid  Fetal Monitoring:  Baseline: 130 bpm, Variability: Good {> 6 bpm), Accelerations: Reactive and Decelerations: Absent  Labs:  Results for orders placed or performed during the hospital encounter of 02/07/17 (from the past 24 hour(s))  [facility]   Collection Time: 02/08/17  2:53 PM  Result Value Ref Range   Amnisure ROM POSITIVE     Imaging Studies:     C Medications:  Scheduled . [START ON 02/10/2017] amoxicillin  500 mg Oral Q8H  . docusate sodium  100 mg Oral Daily  . ferrous sulfate  325 mg Oral BID WC  . oxytocin 40 units in LR 1000 mL  500 mL Intravenous Once  . prenatal multivitamin  1 tablet Oral Q1200   I have reviewed the patient's current medications.  ASSESSMENT: Patient Active Problem List   Diagnosis Date Noted  . Threatened preterm  labor, third trimester 02/07/2017  . [redacted] weeks gestation of pregnancy 02/07/2017  . Marijuana abuse 12/25/2016  . Anemia in pregnancy 12/25/2016  . Supervision of high risk pregnancy, antepartum 12/25/2016  . Late prenatal care affecting pregnancy in second trimester 12/15/2016  . AMA (advanced maternal age) multigravida 35+, second trimester 12/15/2016  . Short cervix during pregnancy in second trimester 12/13/2016  . Trichomonal vaginitis during pregnancy in second trimester 11/25/2016  . History of preterm delivery 11/25/2016  PPROM  PLAN: 1. Day 2 of latency antibiotics. 2 daily fetal monitoring  3 s/p Betamethasone 4 inpt care til delivery at 34 wk or as clinically indicated.  Tilda BurrowFERGUSON,[name] 02/09/2017,9:25 AM    Patient ID: Destiny Schneider, female   DOB: 24-Sep-1980, 36 y.o.   MRN: [id number]

## 2017-02-10 LAB — TYPE AND SCREEN
ABO/RH(D): A POS
Antibody Screen: NEGATIVE

## 2017-02-10 NOTE — Progress Notes (Signed)
Patient ID: Destiny Schneider, female   DOB: 09/17/1980, 36 y.o.   MRN: [id number] FACULTY PRACTICE ANTEPARTUM(COMPREHENSIVE) NOTE  Destiny Schneider is a 36 y.o. [id number] at 556w2d by best clinical estimate who is admitted for PROM.   Fetal presentation is cephalic. Length of Stay:  3  Days  Subjective: Continues to leak clear fluid, would like to ambulate in halls. Patient reports the fetal movement as active. Patient reports uterine contraction  activity as none. Patient reports  vaginal bleeding as none. Patient describes fluid per vagina as Clear.  Vitals:  Blood pressure 96/60, pulse 78, temperature 98.2 F (36.8 C), temperature source Oral, resp. rate 18, height 5\' 7"  (1.702 m), weight 139 lb (63 kg), last menstrual period 08/03/2016, SpO2 99 %. Physical Examination:  General appearance - alert, well appearing, and in no distress Chest - normal effort Abdomen - gravid, NT Fundal Height:  size equals dates Extremities: Homans sign is negative, no sign of DVT  Membranes:ruptured, clear fluid  Fetal Monitoring:  Baseline: 130 bpm, Variability: Good {> 6 bpm), Accelerations: Reactive and Decelerations: Absent   Medications:  Scheduled . amoxicillin  500 mg Oral Q8H  . docusate sodium  100 mg Oral Daily  . ferrous sulfate  325 mg Oral BID WC  . oxytocin 40 units in LR 1000 mL  500 mL Intravenous Once  . prenatal multivitamin  1 tablet Oral Q1200   I have reviewed the patient's current medications.  ASSESSMENT: Principal Problem:   Preterm premature rupture of membranes   PLAN: Continue latency Abx S/p Flagyl for Trich S/p BMZ Delivery with s/sx's or chorio or labor  Reva Boresanya S Jayleene Glaeser, MD 02/10/2017,9:40 AM

## 2017-02-11 ENCOUNTER — Encounter (HOSPITAL_COMMUNITY)
Admission: AD | Disposition: A | Discharge status: Home / Self Care | Payer: Self-pay | Source: Ambulatory Visit | Attending: Family Medicine

## 2017-02-11 ENCOUNTER — Encounter (HOSPITAL_COMMUNITY): Payer: Self-pay | Admitting: Anesthesiology

## 2017-02-11 ENCOUNTER — Inpatient Hospital Stay (HOSPITAL_COMMUNITY): Payer: Medicaid Other | Admitting: Anesthesiology

## 2017-02-11 DIAGNOSIS — O320XX0 Maternal care for unstable lie, not applicable or unspecified: Secondary | ICD-10-CM

## 2017-02-11 DIAGNOSIS — Z3A32 32 weeks gestation of pregnancy: Secondary | ICD-10-CM

## 2017-02-11 DIAGNOSIS — O42113 Preterm premature rupture of membranes, onset of labor more than 24 hours following rupture, third trimester: Secondary | ICD-10-CM

## 2017-02-11 LAB — CBC
HCT: 29.9 % — ABNORMAL LOW (ref 36.0–46.0)
Hemoglobin: 10.2 g/dL — ABNORMAL LOW (ref 12.0–15.0)
MCH: 33.1 pg (ref 26.0–34.0)
MCHC: 34.1 g/dL (ref 30.0–36.0)
MCV: 97.1 fL (ref 78.0–100.0)
Platelets: 218 10*3/uL (ref 150–400)
RBC: 3.08 MIL/uL — ABNORMAL LOW (ref 3.87–5.11)
RDW: 14.2 % (ref 11.5–15.5)
WBC: 10.6 10*3/uL — ABNORMAL HIGH (ref 4.0–10.5)

## 2017-02-11 SURGERY — Surgical Case
Anesthesia: Epidural | Site: Abdomen | Wound class: Clean Contaminated

## 2017-02-11 MED ORDER — LACTATED RINGERS IV SOLN
INTRAVENOUS | Status: DC | PRN
  Administered 2017-02-11: 10:00:00 via INTRAVENOUS

## 2017-02-11 MED ORDER — IBUPROFEN 800 MG PO TABS
800.0000 mg | ORAL_TABLET | Freq: Three times a day (TID) | ORAL | Status: DC
  Administered 2017-02-11 – 2017-02-13 (×5): 800 mg via ORAL
  Filled 2017-02-11 (×5): qty 1

## 2017-02-11 MED ORDER — MENTHOL 3 MG MT LOZG: 1.0000 | LOZENGE | OROMUCOSAL | Status: DC | PRN

## 2017-02-11 MED ORDER — MORPHINE SULFATE (PF) 0.5 MG/ML IJ SOLN
INTRAMUSCULAR | Status: AC
  Filled 2017-02-11: qty 10

## 2017-02-11 MED ORDER — SIMETHICONE 80 MG PO CHEW: 80.0000 mg | CHEWABLE_TABLET | ORAL | Status: DC | PRN

## 2017-02-11 MED ORDER — SOD CITRATE-CITRIC ACID 500-334 MG/5ML PO SOLN
ORAL | Status: AC
  Administered 2017-02-11: 30 mL via ORAL
  Filled 2017-02-11: qty 15

## 2017-02-11 MED ORDER — MAGNESIUM HYDROXIDE 400 MG/5ML PO SUSP: 30.0000 mL | ORAL | Status: DC | PRN

## 2017-02-11 MED ORDER — DIPHENHYDRAMINE HCL 50 MG/ML IJ SOLN: 12.5000 mg | INTRAMUSCULAR | Status: DC | PRN

## 2017-02-11 MED ORDER — EPHEDRINE 5 MG/ML INJ: 10.0000 mg | INTRAVENOUS | Status: DC | PRN

## 2017-02-11 MED ORDER — SCOPOLAMINE 1 MG/3DAYS TD PT72
1.0000 | MEDICATED_PATCH | TRANSDERMAL | Status: DC
  Administered 2017-02-11: 1.5 mg via TRANSDERMAL
  Filled 2017-02-11: qty 1

## 2017-02-11 MED ORDER — DIBUCAINE 1 % RE OINT: 1.0000 "application " | TOPICAL_OINTMENT | RECTAL | Status: DC | PRN

## 2017-02-11 MED ORDER — KETOROLAC TROMETHAMINE 30 MG/ML IJ SOLN
INTRAMUSCULAR | Status: AC
  Filled 2017-02-11: qty 1

## 2017-02-11 MED ORDER — OXYCODONE-ACETAMINOPHEN 5-325 MG PO TABS
2.0000 | ORAL_TABLET | ORAL | Status: DC | PRN
  Administered 2017-02-13: 2 via ORAL
  Filled 2017-02-11: qty 2

## 2017-02-11 MED ORDER — OXYTOCIN 10 UNIT/ML IJ SOLN
INTRAVENOUS | Status: DC | PRN
  Administered 2017-02-11: 40 [IU] via INTRAVENOUS

## 2017-02-11 MED ORDER — SIMETHICONE 80 MG PO CHEW
80.0000 mg | CHEWABLE_TABLET | Freq: Three times a day (TID) | ORAL | Status: DC
  Administered 2017-02-11 – 2017-02-13 (×5): 80 mg via ORAL
  Filled 2017-02-11 (×5): qty 1

## 2017-02-11 MED ORDER — KETOROLAC TROMETHAMINE 30 MG/ML IJ SOLN
INTRAMUSCULAR | Status: DC | PRN
  Administered 2017-02-11: 30 mg via INTRAVENOUS

## 2017-02-11 MED ORDER — LACTATED RINGERS IV SOLN
INTRAVENOUS | Status: DC | PRN
  Administered 2017-02-11 (×2): via INTRAVENOUS

## 2017-02-11 MED ORDER — SOD CITRATE-CITRIC ACID 500-334 MG/5ML PO SOLN
30.0000 mL | Freq: Once | ORAL | Status: AC
  Administered 2017-02-11: 30 mL via ORAL

## 2017-02-11 MED ORDER — FENTANYL CITRATE (PF) 100 MCG/2ML IJ SOLN
INTRAMUSCULAR | Status: AC
  Filled 2017-02-11: qty 2

## 2017-02-11 MED ORDER — PHENYLEPHRINE 40 MCG/ML (10ML) SYRINGE FOR IV PUSH (FOR BLOOD PRESSURE SUPPORT)
80.0000 ug | PREFILLED_SYRINGE | INTRAVENOUS | Status: DC | PRN
  Filled 2017-02-11: qty 10

## 2017-02-11 MED ORDER — ONDANSETRON HCL 4 MG/2ML IJ SOLN
INTRAMUSCULAR | Status: DC | PRN
  Administered 2017-02-11: 4 mg via INTRAVENOUS

## 2017-02-11 MED ORDER — PHENYLEPHRINE HCL 10 MG/ML IJ SOLN
INTRAMUSCULAR | Status: DC | PRN
  Administered 2017-02-11 (×3): 80 ug via INTRAVENOUS

## 2017-02-11 MED ORDER — OXYTOCIN 40 UNITS IN LACTATED RINGERS INFUSION - SIMPLE MED: 2.5000 [IU]/h | INTRAVENOUS | Status: AC

## 2017-02-11 MED ORDER — SODIUM BICARBONATE 8.4 % IV SOLN
INTRAVENOUS | Status: DC | PRN
  Administered 2017-02-11: 10 mL via EPIDURAL
  Administered 2017-02-11: 3 mL via EPIDURAL

## 2017-02-11 MED ORDER — FENTANYL CITRATE (PF) 100 MCG/2ML IJ SOLN
50.0000 ug | INTRAMUSCULAR | Status: DC | PRN
  Administered 2017-02-11: 100 ug via INTRAVENOUS

## 2017-02-11 MED ORDER — WITCH HAZEL-GLYCERIN EX PADS: 1.0000 "application " | MEDICATED_PAD | CUTANEOUS | Status: DC | PRN

## 2017-02-11 MED ORDER — DIPHENHYDRAMINE HCL 25 MG PO CAPS: 25.0000 mg | ORAL_CAPSULE | Freq: Four times a day (QID) | ORAL | Status: DC | PRN

## 2017-02-11 MED ORDER — BUPIVACAINE HCL (PF) 0.5 % IJ SOLN
INTRAMUSCULAR | Status: DC | PRN
  Administered 2017-02-11: 30 mL

## 2017-02-11 MED ORDER — LACTATED RINGERS IV SOLN: INTRAVENOUS | Status: DC

## 2017-02-11 MED ORDER — TERBUTALINE SULFATE 1 MG/ML IJ SOLN: 0.2500 mg | Freq: Once | INTRAMUSCULAR | Status: DC | PRN

## 2017-02-11 MED ORDER — OXYTOCIN 10 UNIT/ML IJ SOLN
INTRAMUSCULAR | Status: AC
  Filled 2017-02-11: qty 4

## 2017-02-11 MED ORDER — SENNOSIDES-DOCUSATE SODIUM 8.6-50 MG PO TABS
2.0000 | ORAL_TABLET | ORAL | Status: DC
  Administered 2017-02-11 – 2017-02-13 (×2): 2 via ORAL
  Filled 2017-02-11 (×2): qty 2

## 2017-02-11 MED ORDER — SODIUM CHLORIDE 0.9 % IR SOLN
Status: DC | PRN
  Administered 2017-02-11: 1000 mL

## 2017-02-11 MED ORDER — FENTANYL 2.5 MCG/ML BUPIVACAINE 1/10 % EPIDURAL INFUSION (WH - ANES)
14.0000 mL/h | INTRAMUSCULAR | Status: DC | PRN
  Administered 2017-02-11: 14 mL/h via EPIDURAL
  Filled 2017-02-11 (×2): qty 100

## 2017-02-11 MED ORDER — ZOLPIDEM TARTRATE 5 MG PO TABS: 5.0000 mg | ORAL_TABLET | Freq: Every evening | ORAL | Status: DC | PRN

## 2017-02-11 MED ORDER — KETOROLAC TROMETHAMINE 30 MG/ML IJ SOLN
30.0000 mg | Freq: Four times a day (QID) | INTRAMUSCULAR | Status: AC
  Administered 2017-02-11 (×2): 30 mg via INTRAVENOUS
  Filled 2017-02-11 (×3): qty 1

## 2017-02-11 MED ORDER — MORPHINE SULFATE (PF) 0.5 MG/ML IJ SOLN
INTRAMUSCULAR | Status: DC | PRN
  Administered 2017-02-11: 3 mg via EPIDURAL

## 2017-02-11 MED ORDER — OXYCODONE-ACETAMINOPHEN 5-325 MG PO TABS
1.0000 | ORAL_TABLET | ORAL | Status: DC | PRN
  Administered 2017-02-12 – 2017-02-13 (×2): 1 via ORAL
  Filled 2017-02-11 (×2): qty 1

## 2017-02-11 MED ORDER — TETANUS-DIPHTH-ACELL PERTUSSIS 5-2.5-18.5 LF-MCG/0.5 IM SUSP: 0.5000 mL | Freq: Once | INTRAMUSCULAR | Status: DC

## 2017-02-11 MED ORDER — BUPIVACAINE HCL (PF) 0.5 % IJ SOLN
INTRAMUSCULAR | Status: AC
  Filled 2017-02-11: qty 30

## 2017-02-11 MED ORDER — LACTATED RINGERS IV SOLN: 500.0000 mL | Freq: Once | INTRAVENOUS | Status: DC

## 2017-02-11 MED ORDER — PENICILLIN G POTASSIUM 5000000 UNITS IJ SOLR
5.0000 10*6.[IU] | Freq: Once | INTRAVENOUS | Status: AC
  Administered 2017-02-11: 5 10*6.[IU] via INTRAVENOUS
  Filled 2017-02-11: qty 5

## 2017-02-11 MED ORDER — CEFAZOLIN SODIUM-DEXTROSE 2-4 GM/100ML-% IV SOLN
2.0000 g | Freq: Once | INTRAVENOUS | Status: AC
  Administered 2017-02-11: 2 g via INTRAVENOUS

## 2017-02-11 MED ORDER — SCOPOLAMINE 1 MG/3DAYS TD PT72
MEDICATED_PATCH | TRANSDERMAL | Status: AC
  Administered 2017-02-11: 1.5 mg via TRANSDERMAL
  Filled 2017-02-11: qty 1

## 2017-02-11 MED ORDER — LIDOCAINE HCL (PF) 1 % IJ SOLN
INTRAMUSCULAR | Status: DC | PRN
Start: 2017-02-11 — End: 2017-02-11
  Administered 2017-02-11: 4 mL via EPIDURAL
  Administered 2017-02-11: 5 mL

## 2017-02-11 MED ORDER — PHENYLEPHRINE 40 MCG/ML (10ML) SYRINGE FOR IV PUSH (FOR BLOOD PRESSURE SUPPORT): 80.0000 ug | PREFILLED_SYRINGE | INTRAVENOUS | Status: DC | PRN

## 2017-02-11 MED ORDER — PENICILLIN G POT IN DEXTROSE 60000 UNIT/ML IV SOLN
3.0000 10*6.[IU] | INTRAVENOUS | Status: DC
  Administered 2017-02-11: 3 10*6.[IU] via INTRAVENOUS
  Filled 2017-02-11 (×3): qty 50

## 2017-02-11 MED ORDER — PRENATAL MULTIVITAMIN CH
1.0000 | ORAL_TABLET | Freq: Every day | ORAL | Status: DC
  Administered 2017-02-12: 1 via ORAL
  Filled 2017-02-11: qty 1

## 2017-02-11 MED ORDER — FENTANYL CITRATE (PF) 100 MCG/2ML IJ SOLN
INTRAMUSCULAR | Status: DC | PRN
  Administered 2017-02-11: 50 ug via INTRAVENOUS

## 2017-02-11 MED ORDER — SIMETHICONE 80 MG PO CHEW
80.0000 mg | CHEWABLE_TABLET | ORAL | Status: DC
  Administered 2017-02-11: 80 mg via ORAL
  Filled 2017-02-11 (×2): qty 1

## 2017-02-11 MED ORDER — COCONUT OIL OIL: 1.0000 "application " | TOPICAL_OIL | Status: DC | PRN

## 2017-02-11 MED ORDER — ONDANSETRON HCL 4 MG/2ML IJ SOLN
INTRAMUSCULAR | Status: AC
  Filled 2017-02-11: qty 2

## 2017-02-11 MED ORDER — ONDANSETRON HCL 4 MG/2ML IJ SOLN
4.0000 mg | Freq: Four times a day (QID) | INTRAMUSCULAR | Status: DC | PRN
  Administered 2017-02-11: 4 mg via INTRAVENOUS
  Filled 2017-02-11: qty 2

## 2017-02-11 SURGICAL SUPPLY — 42 items
APL SKNCLS STERI-STRIP NONHPOA (GAUZE/BANDAGES/DRESSINGS) ×1
BENZOIN TINCTURE PRP APPL 2/3 (GAUZE/BANDAGES/DRESSINGS) ×3 IMPLANT
CHLORAPREP W/TINT 26ML (MISCELLANEOUS) ×3 IMPLANT
CLAMP CORD UMBIL (MISCELLANEOUS) IMPLANT
CLOSURE WOUND 1/2 X4 (GAUZE/BANDAGES/DRESSINGS) ×1
CLOTH BEACON ORANGE TIMEOUT ST (SAFETY) ×3 IMPLANT
DRAPE C SECTION CLR SCREEN (DRAPES) ×3 IMPLANT
DRSG OPSITE POSTOP 4X10 (GAUZE/BANDAGES/DRESSINGS) ×3 IMPLANT
ELECT REM PT RETURN 9FT ADLT (ELECTROSURGICAL) ×3
ELECTRODE REM PT RTRN 9FT ADLT (ELECTROSURGICAL) ×1 IMPLANT
EXTRACTOR VACUUM M CUP 4 TUBE (SUCTIONS) IMPLANT
EXTRACTOR VACUUM M CUP 4' TUBE (SUCTIONS)
GLOVE BIO SURGEON STRL SZ7.5 (GLOVE) ×3 IMPLANT
GLOVE BIOGEL PI IND STRL 7.0 (GLOVE) ×1 IMPLANT
GLOVE BIOGEL PI INDICATOR 7.0 (GLOVE) ×2
GOWN STRL REUS W/TWL 2XL LVL3 (GOWN DISPOSABLE) ×3 IMPLANT
GOWN STRL REUS W/TWL LRG LVL3 (GOWN DISPOSABLE) ×6 IMPLANT
KIT ABG SYR 3ML LUER SLIP (SYRINGE) ×3 IMPLANT
NEEDLE HYPO 22GX1.5 SAFETY (NEEDLE) ×3 IMPLANT
NEEDLE HYPO 25X5/8 SAFETYGLIDE (NEEDLE) ×3 IMPLANT
NS IRRIG 1000ML POUR BTL (IV SOLUTION) ×3 IMPLANT
PACK C SECTION WH (CUSTOM PROCEDURE TRAY) ×3 IMPLANT
PAD OB MATERNITY 4.3X12.25 (PERSONAL CARE ITEMS) ×3 IMPLANT
PENCIL SMOKE EVAC W/HOLSTER (ELECTROSURGICAL) ×3 IMPLANT
RTRCTR C-SECT PINK 25CM LRG (MISCELLANEOUS) ×3 IMPLANT
SPONGE LAP 18X18 X RAY DECT (DISPOSABLE) ×3 IMPLANT
STRIP CLOSURE SKIN 1/2X4 (GAUZE/BANDAGES/DRESSINGS) ×2 IMPLANT
SUT CHROMIC 1 CTX 36 (SUTURE) ×12 IMPLANT
SUT VIC AB 1 CT1 27 (SUTURE) ×6
SUT VIC AB 1 CT1 27XBRD ANTBC (SUTURE) ×2 IMPLANT
SUT VIC AB 2-0 CT1 (SUTURE) ×3 IMPLANT
SUT VIC AB 2-0 CT1 27 (SUTURE) ×3
SUT VIC AB 2-0 CT1 TAPERPNT 27 (SUTURE) ×1 IMPLANT
SUT VIC AB 3-0 CT1 27 (SUTURE) ×6
SUT VIC AB 3-0 CT1 TAPERPNT 27 (SUTURE) ×2 IMPLANT
SUT VIC AB 3-0 SH 27 (SUTURE)
SUT VIC AB 3-0 SH 27X BRD (SUTURE) IMPLANT
SUT VIC AB 4-0 KS 27 (SUTURE) ×3 IMPLANT
SYR BULB IRRIGATION 50ML (SYRINGE) ×6 IMPLANT
SYR CONTROL 10ML LL (SYRINGE) ×3 IMPLANT
TOWEL OR 17X24 6PK STRL BLUE (TOWEL DISPOSABLE) ×3 IMPLANT
TRAY FOLEY BAG SILVER LF 14FR (SET/KITS/TRAYS/PACK) ×3 IMPLANT

## 2017-02-11 NOTE — Progress Notes (Signed)
Patient ID: Destiny Schneider, female   DOB: March 05, 1981, 36 y.o.   MRN: [id number] OB Attending  Pt only have contractions q 10 mins now after Epidural. However having decelerations to 60-70's with contraction.  SVE 5/80/transveres  U/S confirms transverse lie  A/P  IUP 32 3/7 weeks  PROM NRFHT's Unstable lie  D/T NRFHT's and unstable lie c section recommended to pt. R/B/Post op care reviewed. Pt verbalized understanding and agrees with POC. Nursing and anesthesia notified.

## 2017-02-11 NOTE — Transfer of Care (Signed)
Immediate Anesthesia Transfer of Care Note  Patient: Destiny Schneider  Procedure(s) Performed: Procedure(s): CESAREAN SECTION (N/A)  Patient Location: PACU  Anesthesia Type:Epidural  Level of Consciousness: awake and alert   Airway & Oxygen Therapy: Patient Spontanous Breathing  Post-op Assessment: Report given to RN and Post -op Vital signs reviewed and stable  Post vital signs: Reviewed  Last Vitals:  Vitals:   02/11/17 1050 02/11/17 1051  BP: (!) 93/55   Pulse: 70 68  Resp: 14 14  Temp:      Last Pain:  Vitals:   02/11/17 1047  TempSrc: Oral  PainSc: 0-No pain      Patients Stated Pain Goal: 0 (02/10/17 1712)  Complications: No apparent anesthesia complications

## 2017-02-11 NOTE — Consult Note (Signed)
Neonatology Note:   Attendance at C-section:    I was asked by Dr. Alysia PennaErvin to attend this primary C/S at 32 3/7 weeks due to transverse lie, NRFHR, and PPROM and PTL. The mother is a G3P1A1 A pos, GBS unknown with short cervix (pessary), preterm labor, PPROM, and repeated Trichomonas infection treated during pregnancy. She smokes 1/2 ppd cigarettes and uses alcohol occasionally. [facility] Previous fetal loss at 20 weeks and a 32 week preterm delivery. She got Betamethasone 5/29-30, a magnesium sulfate bolus on 5/29, and was treated with Ampicillin, Azithromycin, and Pen G > 4 hours before delivery due to unknown GBS status. She has been afebrile. She recently got Flagyl, Procardia, and got 1 IV dose of Fentanyl today at 0140. ROM 43 hours prior to delivery, fluid clear at that time, but bloody at C-section. Infant vigorous with good spontaneous cry and tone. Delayed cord clamping was done. Needed only minimal bulb suctioning. Pulse oximeter placed, O2 sats 90+% in room air by 4-5 minutes without need for resuscitation. Ap 9/9. Seen briefly by his mother in the OR, then transported to the NICU for further care, with his father in attendance.   Doretha Souhristie C. Pharrah Rottman, MD

## 2017-02-11 NOTE — Anesthesia Preprocedure Evaluation (Signed)
Anesthesia Evaluation  Patient identified by MRN, date of birth, ID band Patient awake    Reviewed: Allergy & Precautions, Patient's Chart, lab work & pertinent test results  Airway Mallampati: II  TM Distance: >3 FB Neck ROM: Full    Dental no notable dental hx. (+) Teeth Intact   Pulmonary asthma , Current Smoker,    Pulmonary exam normal breath sounds clear to auscultation       Cardiovascular negative cardio ROS Normal cardiovascular exam Rhythm:Regular Rate:Normal     Neuro/Psych negative neurological ROS  negative psych ROS   GI/Hepatic Neg liver ROS, GERD  ,  Endo/Other  negative endocrine ROS  Renal/GU negative Renal ROS  negative genitourinary   Musculoskeletal negative musculoskeletal ROS (+)   Abdominal   Peds  Hematology  (+) anemia ,   Anesthesia Other Findings   Reproductive/Obstetrics (+) Pregnancy                             Anesthesia Physical Anesthesia Plan  ASA: II  Anesthesia Plan: Epidural   Post-op Pain Management:    Induction:   Airway Management Planned:   Additional Equipment:   Intra-op Plan:   Post-operative Plan:   Informed Consent: I have reviewed the patients History and Physical, chart, labs and discussed the procedure including the risks, benefits and alternatives for the proposed anesthesia with the patient or authorized representative who has indicated his/her understanding and acceptance.     Plan Discussed with: Anesthesiologist  Anesthesia Plan Comments:         Anesthesia Quick Evaluation

## 2017-02-11 NOTE — Progress Notes (Signed)
Pt being wheeled down to L&D. 

## 2017-02-11 NOTE — Anesthesia Postprocedure Evaluation (Signed)
Anesthesia Post Note  Patient: Destiny Schneider  Procedure(s) Performed: Procedure(s) (LRB): CESAREAN SECTION (N/A)     Patient location during evaluation: Mother Baby Anesthesia Type: Epidural Level of consciousness: awake and alert, oriented and patient cooperative Pain management: pain level controlled Vital Signs Assessment: post-procedure vital signs reviewed and stable Respiratory status: spontaneous breathing Cardiovascular status: stable Postop Assessment: no headache, epidural receding, patient able to bend at knees and no signs of nausea or vomiting Anesthetic complications: no Comments: Pain score 3.    Last Vitals:  Vitals:   02/11/17 1559 02/11/17 1707  BP: 97/60 (!) 105/53  Pulse: 64 64  Resp: 18 20  Temp: 36.8 C 36.6 C    Last Pain:  Vitals:   02/11/17 1707  TempSrc: Oral  PainSc:    Pain Goal: Patients Stated Pain Goal: 3 (02/11/17 1253)               Merrilyn PumaWRINKLE,Destiny Schneider

## 2017-02-11 NOTE — Op Note (Signed)
Cesarean Section Procedure Note  02/07/2017 - 02/11/2017  10:40 AM  PATIENT:  Destiny Schneider  36 y.o. female  PRE-OPERATIVE DIAGNOSIS:  Cesearan section for non reassuring fetal heart tones, unstable lie  POST-OPERATIVE DIAGNOSIS:  Cesearan section for non reassuring fetal heart tones, unstable lie  PROCEDURE:  Procedure(s): CESAREAN SECTION (N/A)  SURGEON:  Surgeon(s) and Role:    * Hermina StaggersErvin, Kees Idrovo L, MD - Primary  ASSISTANTS: none   ANESTHESIA:   epidural  EBL:  Total I/O In: 1500 [I.V.:1500] Out: 1500 [Urine:500; Blood:1000]  BLOOD ADMINISTERED:none  DRAINS: none   LOCAL MEDICATIONS USED:  NONE  SPECIMEN:  Source of Specimen:  Placenta  DISPOSITION OF SPECIMEN:  PATHOLOGY   Procedure Details   The patient was seen in the Holding Room. The risks, benefits, complications, treatment options, and expected outcomes were discussed with the patient.  The patient concurred with the proposed plan, giving informed consent.  The site of surgery properly noted/marked. The patient was taken to Operating Room # 9, identified as Destiny Schneider and the procedure verified as C-Section Delivery. A Time Out was held and the above information confirmed.  After induction of anesthesia, the patient was draped and prepped in the usual sterile manner. A Pfannenstiel incision was made and carried down through the subcutaneous tissue to the fascia. Fascial incision was made and extended transversely. The fascia was separated from the underlying rectus tissue superiorly and inferiorly. The peritoneum was identified and entered. Peritoneal incision was extended longitudinally. Alexis retractor. There was complex venous lake noted at the low transverse uterine segment.  A low transverse uterine incision was made. Delivered from transverse presentation was a pending gram Female with Apgar scores of pending at one minute and pending at five minutes. After the umbilical cord was clamped and cut  cord blood was obtained for evaluation. Infant was passed off to NICU . The placenta was removed intact and appeared normal. The uterine outline, tubes and ovaries appeared normal. The uterine incision was closed with running locked sutures of Chromic. A second layer closure was performed with interrupted Chromic . Hemostasis was observed. The uterine incision was noted to J to toward the right round ligament. Lavage was carried out until clear. The abd muscle and peritoneum were closed with 2/0 Vicryl. The fascia was then reapproximated with running sutures of Vicryl from conner to midline. The skin was reapproximated with Vicryl. Stri strips and honeycomb dressing were applied. Infant to NICU on RA.   Instrument, sponge, and needle counts were correct prior the abdominal closure and at the conclusion of the case.   Complications:  None; patient tolerated the procedure well.  COUNTS:  YES  PLAN OF CARE: Admit to inpatient   PATIENT DISPOSITION:  PACU - hemodynamically stable.   Delay start of Pharmacological VTE agent (>24hrs) due to surgical blood loss or risk of bleeding: not applicable             Disposition: PACU - hemodynamically stable.         Condition: stable   Hermina StaggersErvin, Julis Haubner L, MD 02/11/2017 10:40 AM

## 2017-02-11 NOTE — Anesthesia Postprocedure Evaluation (Signed)
Anesthesia Post Note  Patient: Destiny Schneider  Procedure(s) Performed: Procedure(s) (LRB): CESAREAN SECTION (N/A)     Patient location during evaluation: PACU Anesthesia Type: Epidural Level of consciousness: awake and alert Pain management: pain level controlled Vital Signs Assessment: post-procedure vital signs reviewed and stable Respiratory status: spontaneous breathing, nonlabored ventilation and respiratory function stable Cardiovascular status: stable Postop Assessment: no headache, no backache and epidural receding Anesthetic complications: no    Last Vitals:  Vitals:   02/11/17 1145 02/11/17 1200  BP: (!) 109/57 112/66  Pulse: 79 71  Resp: 19 16  Temp:      Last Pain:  Vitals:   02/11/17 1200  TempSrc:   PainSc: 0-No pain   Pain Goal: Patients Stated Pain Goal: 0 (02/10/17 1712)               Andrw Mcguirt

## 2017-02-11 NOTE — Progress Notes (Signed)
Pt called out reporting 10/10 lower abdominal pain that comes and goes roughly every 10 minutes. Placed pt on monitor. Called Dr. Alysia PennaErvin to update on pt status. He is on his way up to evaluate pt.

## 2017-02-11 NOTE — Progress Notes (Signed)
Patient ID: Destiny Schneider, female   DOB: 1981/01/18, 36 y.o.   MRN: [id number] Pt awoke with c/o ut ctx q5-10 minutes. Rating 10/10 SVE 5/80%/-1 Will transfer to L & D for management of labor.

## 2017-02-11 NOTE — Anesthesia Procedure Notes (Signed)
Epidural Patient location during procedure: OB Start time: 02/11/2017 2:41 AM  Staffing Anesthesiologist: Mal AmabileFOSTER, Sahid Borba Performed: anesthesiologist   Preanesthetic Checklist Completed: patient identified, site marked, surgical consent, pre-op evaluation, timeout performed, IV checked, risks and benefits discussed and monitors and equipment checked  Epidural Patient position: sitting Prep: site prepped and draped and DuraPrep Patient monitoring: continuous pulse ox and blood pressure Approach: midline Location: L3-L4 Injection technique: LOR air  Needle:  Needle type: Tuohy  Needle gauge: 17 G Needle length: 9 cm and 9 Needle insertion depth: 4 cm Catheter type: closed end flexible Catheter size: 19 Gauge Catheter at skin depth: 9 cm Test dose: negative and Other  Assessment Events: blood not aspirated, injection not painful, no injection resistance, negative IV test and no paresthesia  Additional Notes Patient identified. Risks and benefits discussed including failed block, incomplete  Pain control, post dural puncture headache, nerve damage, paralysis, blood pressure Changes, nausea, vomiting, reactions to medications-both toxic and allergic and post Partum back pain. All questions were answered. Patient expressed understanding and wished to proceed. Sterile technique was used throughout procedure. Epidural site was Dressed with sterile barrier dressing. No paresthesias, signs of intravascular injection Or signs of intrathecal spread were encountered.  Patient was more comfortable after the epidural was dosed. Please see RN's note for documentation of vital signs and FHR which are stable.

## 2017-02-11 NOTE — Anesthesia Pain Management Evaluation Note (Signed)
  CRNA Pain Management Visit Note  Patient: Destiny Schneider, 36 y.o., female  "Hello I am a [facility] the [facility]. We have an anesthesia team available at all times to provide care throughout the hospital, including epidural management and anesthesia for C-section. I don't know your plan for the delivery whether it a natural birth, water birth, IV sedation, nitrous supplementation, doula or epidural, but we want to meet your pain goals."   1.Was your pain managed to your expectations on prior hospitalizations?   Yes   2.What is your expectation for pain management during this hospitalization?     Epidural  3.How can we help you reach that goal? unsure  Record the patient's initial score and the patient's pain goal.   Pain: 0  Pain Goal: 10 [facility] wants you to be able to say your pain was always managed very well.  Destiny Schneider,Destiny Schneider 02/11/2017

## 2017-02-12 LAB — CBC
HCT: 24.2 % — ABNORMAL LOW (ref 36.0–46.0)
Hemoglobin: 8.4 g/dL — ABNORMAL LOW (ref 12.0–15.0)
MCH: 33.7 pg (ref 26.0–34.0)
MCHC: 34.7 g/dL (ref 30.0–36.0)
MCV: 97.2 fL (ref 78.0–100.0)
PLATELETS: 197 10*3/uL (ref 150–400)
RBC: 2.49 MIL/uL — AB (ref 3.87–5.11)
RDW: 14.2 % (ref 11.5–15.5)
WBC: 13.6 10*3/uL — ABNORMAL HIGH (ref 4.0–10.5)

## 2017-02-12 MED ORDER — LACTATED RINGERS IV BOLUS (SEPSIS)
1000.0000 mL | Freq: Once | INTRAVENOUS | Status: AC
  Administered 2017-02-12: 1000 mL via INTRAVENOUS

## 2017-02-12 NOTE — Progress Notes (Signed)
Spoke to Nurse Midwife about patient's decrease urinary output, gave a verbal order of 1000 mls of Lactated Ringers bolus over an hour or 2 and monitor patient's urine and also call back with concerns. Will carry out order as instructed.

## 2017-02-12 NOTE — Progress Notes (Signed)
Patient doesn't seem to be compliant and not interested in her care. Patient was instructed to walk but refused saying "I walked to see baby down there". Patient was also instructed to drink more fluids especially water because her urine is very concentrated with decrease output. She seems not to be compliant she is been several times drinking sodas and juices brought to her by significant other. Patient has been educated on the importance of ambulation and getting hydrated after major surgery.

## 2017-02-12 NOTE — Progress Notes (Signed)
Subjective: Postpartum Day 1: Cesarean Delivery Patient reports feeling well. She denies chest pain, SOB, lightheadedness/dizziness    Objective: Vital signs in last 24 hours: Temp:  [97.7 F (36.5 C)-98.4 F (36.9 C)] 98.4 F (36.9 C) (06/03 1330) Pulse Rate:  [61-81] 79 (06/03 1330) Resp:  [18-20] 18 (06/03 1330) BP: (97-109)/(47-92) 109/59 (06/03 1330) SpO2:  [98 %-100 %] 100 % (06/03 1330)  Physical Exam:  General: alert, cooperative and no distress Lochia: appropriate Uterine Fundus: firm Incision: dressing is clean dry and intact DVT Evaluation: No evidence of DVT seen on physical exam. Negative Homan's sign.   Recent Labs  02/11/17 0147 02/12/17 0820  HGB 10.2* 8.4*  HCT 29.9* 24.2*    Assessment/Plan: Status post Cesarean section. Doing well postoperatively.  Continue current care. Encourage ambulation  Aaron Bostwick 02/12/2017, 1:45 PM

## 2017-02-12 NOTE — Clinical Social Work Maternal (Signed)
CLINICAL SOCIAL WORK MATERNAL/CHILD NOTE  Patient Details  Name: Destiny Schneider MRN: [id number] Date of Birth: [birth year]  Date:  02/12/2017  Clinical Social Worker Initiating Note:  Ferdinand Lango Mitchelle Goerner, MSW, LCSW-A  Date/ Time Initiated:  02/12/17/1421     Child's Name:  Undecided at this time    Legal Guardian:  Other (Comment) (Not established y court system; MOB and FOB parent collectively )   Need for Interpreter:  None   Date of Referral:   (No referral-NICU admit)     Reason for Referral:  Other (Comment) (NICU admit)   Referral Source:  Other (Comment) (None)   Address:  [place], [place] [address]  Phone number:  [phone]   Household Members:  Self, Minor Children, Significant Other   Natural Supports (not living in the home):  Extended Family, Friends, Artist Supports: None   Employment: Unemployed   Type of Work: Unemployed   Education:  Database administrator Resources:  Medicaid   Other Resources:  Other (Comment) (None reported)   Cultural/Religious Considerations Which May Impact Care:  None reported.   Strengths:  Ability to meet basic needs , Compliance with medical plan    Risk Factors/Current Problems:  None   Cognitive State:  Alert , Able to Concentrate , Goal Oriented , Insightful    Mood/Affect:  Calm , Comfortable , Interested , Happy    CSW Assessment: CSW met with MOB at NICU bedside to complete assessment due to baby's NICU admission, [facility] and MOB experience fetal demise in the past. MOB was nice and welcoming of this Probation officer. CSW explained role and reasoning for visit. MOB understood noting she is doing well and noted so is baby. CSW informed MOB she is happy to hear that; however, wanted to check in and review things with her. MOB noted this would be ok. This Probation officer inquired about [facility]. MOB noted she was unaware that she was pregnant at the time so she didn't get [facility] until pregnancy  was confirmed. CSW inquired if there will be any barriers to baby and she getting to dr's visits upon d/c. MOB notes there is not. MOB notes she has not yet picked a pediatrician but she is looking into a few. CSW offered pediatrician list. MOB declined noting she has a few she is thinking of already. CSW discussed hospitals policy and procedure regarding [facility] and two routine drug screens. MOB verbalized understanding. This Probation officer informed MOB that baby's UDS was negative; however, CDS is pending. MOB verbalized understanding noting she does/did not engage in substance use during pregnancy so she is not worried. This Probation officer reviewed PPD and safe sleeping/SIDS. MOB verbalized understanding.   Upon further psychosocial assessment, MOB denies any needs; however, notes if baby is not 5+lbs upon d/c she may need assistance with getting a car seat that is appropriate for his weight. CSW informed MOB that we have car seats here for $30 but cannot guarantee a car seat specific for his weight gain will be available at the time of his d/c. MOB understood. At this time, no other needs were addressed or requested. CSW has no barriers to d/c; however, is available to offer support services/resources if appropriate during NICU admission.   CSW Plan/Description:  Other (Comment), Information/Referral to [employer] , No Further Intervention Required/No Barriers to Discharge, Patient/Family Education     Water quality scientist, MSW, [facility]  Office: [phone]

## 2017-02-12 NOTE — Progress Notes (Signed)
Patient refused foley to be removed now, suggested to  RN to wait till she is fully awake before it can be removed.

## 2017-02-13 MED ORDER — IBUPROFEN 800 MG PO TABS: 800.0000 mg | ORAL_TABLET | Freq: Three times a day (TID) | ORAL | 0 refills | Status: DC | PRN

## 2017-02-13 MED ORDER — SIMETHICONE 80 MG PO CHEW: 80.0000 mg | CHEWABLE_TABLET | ORAL | 0 refills | Status: DC | PRN

## 2017-02-13 MED ORDER — OXYCODONE-ACETAMINOPHEN 5-325 MG PO TABS: 1.0000 | ORAL_TABLET | Freq: Four times a day (QID) | ORAL | 0 refills | Status: DC | PRN

## 2017-02-13 NOTE — Progress Notes (Signed)
Discharge instructions reviewed and Rx's reviewed & given.  Pt informed RN she was driving herself home.  RN instructed pt she should not drive and needed to find a ride considering she's 2 days post operative.  Pt response was she would attempt to find a ride, but if not was going to drive herself home.  Pt informed to let nurse know when ride arrived and she was ready for discharge.  Pt verbalized understanding & agreeable.

## 2017-02-13 NOTE — Discharge Instructions (Signed)
Cesarean Delivery, Care After Refer to this sheet in the next few weeks. These instructions provide you with information on caring for yourself after your procedure. Your health care provider may also give you specific instructions. Your treatment has been planned according to current medical practices, but problems sometimes occur. Call your health care provider if you have any problems or questions after you go home. HOME CARE INSTRUCTIONS  Take off your bandage in 2-3 days  Only take over-the-counter or prescription medications as directed by your health care provider.  Do not drink alcohol, especially if you are breastfeeding or taking medication to relieve pain.  Do not  smoke tobacco.  Continue to use good perineal care. Good perineal care includes:  Wiping your perineum from front to back.  Keeping your perineum clean.  Check your surgical cut (incision) daily for increased redness, drainage, swelling, or separation of skin.  Shower and clean your incision gently with soap and water every day, by letting warm and soapy water run over the incision, and then pat it dry. If your health care provider says it is okay, leave the incision uncovered. Use a bandage (dressing) if the incision is draining fluid or appears irritated. If the adhesive strips across the incision do not fall off within 7 days, carefully peel them off, after a shower.  Hug a pillow when coughing or sneezing until your incision is healed. This helps to relieve pain.  Do not use tampons, douches or have sexual intercourse, until your health care provider says it is okay.  Wear a well-fitting bra that provides breast support.  Limit wearing support panties or control-top hose.  Drink enough fluids to keep your urine clear or pale yellow.  Eat high-fiber foods such as whole grain cereals and breads, brown rice, beans, and fresh fruits and vegetables every day. These foods may help prevent or relieve  constipation.  Resume activities such as climbing stairs, driving, lifting, exercising, or traveling as directed by your health care provider.  Try to have someone help you with your household activities and your newborn for at least a few days after you leave the hospital.  Rest as much as possible. Try to rest or take a nap when your newborn is sleeping.  Increase your activities gradually.  Do not lift more than 15lbs until directed by a provider.  Keep all of your scheduled postpartum appointments. It is very important to keep your scheduled follow-up appointments. At these appointments, your health care provider will be checking to make sure that you are healing physically and emotionally. SEEK MEDICAL CARE IF:   You are passing large clots from your vagina. Save any clots to show your health care provider.  You have a foul smelling discharge from your vagina.  You have trouble urinating.  You are urinating frequently.  You have pain when you urinate.  You have a change in your bowel movements.  You have increasing redness, pain, or swelling near your incision.  You have pus draining from your incision.  Your incision is separating.  You have painful, hard, or reddened breasts.  You have a severe headache.  You have blurred vision or see spots.  You feel sad or depressed.  You have thoughts of hurting yourself or your newborn.  You have questions about your care, the care of your newborn, or medications.  You are dizzy or light-headed.  You have a rash.  You have pain, redness, or swelling at the site of the removed  intravenous access (IV) tube.  You have nausea or vomiting.  You stopped breastfeeding and have not had a menstrual period within 12 weeks of stopping.  You are not breastfeeding and have not had a menstrual period within 12 weeks of delivery.  You have a fever. SEEK IMMEDIATE MEDICAL CARE IF:  You have persistent pain.  You have chest  pain.  You have shortness of breath.  You faint.  You have leg pain.  You have stomach pain.  Your vaginal bleeding saturates 2 or more sanitary pads in 1 hour. MAKE SURE YOU:   Understand these instructions.  Will watch your condition.  Will get help right away if you are not doing well or get worse. Document Released: 05/21/2002 Document Revised: 01/13/2014 Document Reviewed: 04/25/2012 [facility] Patient Information 2015 [place], [place]. This information is not intended to replace advice given to you by your health care provider. Make sure you discuss any questions you have with your health care provider.

## 2017-02-13 NOTE — Progress Notes (Addendum)
RN went to round on pt. Pt noted to have left unit, time unknown d/t pt left without notifying staff she was leaving.  RN unsure if pt left alone and drove herself home or found a ride.

## 2017-02-13 NOTE — Discharge Summary (Signed)
Obstetrical Discharge Summary  Date of Admission: 02/07/2017 Date of Discharge: 02/13/2017  Primary OB: [facility]  Gestational Age at Delivery: [id number]   Antepartum complications: Late prenatal care (new OB at 4124wks), cervical insufficiency (diagnosed at anatomy u/s and pessary placed), persistent trichomonas infection, AMA, marijuana abuse, anemia Reason for Admission: preterm labor Date of Delivery: 02/11/2017  Delivered By: Nettie ElmMichael Ervin, MD Delivery Type: primary cesarean section, low transverse incision Intrapartum complications/course: unstable lie. Fetus flipped to transverse presentation during preterm labor Anesthesia: epidural Placenta: Delivered and expressed via active management. Intact: yes. To pathology: yes.  Laceration: n/a Episiotomy: none EBL: 1000mL Baby: Liveborn female, APGARs 9/9, weight 1940 g.    Discharge Diagnosis: Delivered.   Postpartum course: Uncomplicated. She was meeting all her PP/post op goals, including passing flatus on day of discharge.   Discharge Vital Signs:  Current Vital Signs 24h Vital Sign Ranges  T 97.8 F (36.6 C) Temp  Avg: 98 F (36.7 C)  Min: 97.7 F (36.5 C)  Max: 98.4 F (36.9 C)  BP 111/63 BP  Min: 106/66  Max: 114/54  HR 86 Pulse  Avg: 82.2  Min: 78  Max: 87  RR 18 Resp  Avg: 17.4  Min: 16  Max: 18  SaO2 100 % Not Delivered SpO2  Avg: 100 %  Min: 100 %  Max: 100 %       24 Hour I/O Current Shift I/O  Time Ins Outs 06/03 0701 - 06/04 0700 In: -  Out: 1150 [Urine:1150] No intake/output data recorded.   Discharge Exam:  NAD Perineum: deferred Abdomen: firm fundus below the umbilicus, NTTP, ND.  Dressing c/d/i. +BS,  RRR no MRGs CTAB Ext: no c/c/e   Recent Labs Lab 02/07/17 1255 02/11/17 0147 02/12/17 0820  WBC 9.7 10.6* 13.6*  HGB 11.9* 10.2* 8.4*  HCT 35.3* 29.9* 24.2*  PLT 255 218 197    Disposition: Home  Rh Immune globulin given: not applicable Rubella vaccine given: not  applicable Tdap vaccine given in AP or PP setting: ordered  Contraception: still desires BTL  Prenatal/Postnatal Panel: A POS//Rubella Immune//Varicella Unknown//RPR negative//HIV negative/HepB Surface Ag negative//pap no abnormalities (date: 12/2016)//pumping  Plan:  Destiny Schneider was discharged to home in good condition. Follow-up appointment with the outpatient clinic in 3 weeks for a PP visit. Request sent to clinic to call pt and set up appointment.   No future appointments.  Discharge Medications: Allergies as of 02/13/2017   No Known Allergies     Medication List    TAKE these medications   albuterol 108 (90 Base) MCG/ACT inhaler Commonly known as:  PROVENTIL HFA;VENTOLIN HFA Inhale 1-2 puffs into the lungs every 6 (six) hours as needed for wheezing or shortness of breath.   ferrous sulfate 325 (65 FE) MG EC tablet Take 325 mg by mouth 2 (two) times daily after a meal.   ibuprofen 800 MG tablet Commonly known as:  ADVIL,MOTRIN Take 1 tablet (800 mg total) by mouth every 8 (eight) hours as needed.   oxyCODONE-acetaminophen 5-325 MG tablet Commonly known as:  PERCOCET/ROXICET Take 1-2 tablets by mouth every 6 (six) hours as needed (pain scale 4-7).   polyethylene glycol packet Commonly known as:  MIRALAX / GLYCOLAX Take 17 g by mouth daily. What changed:  when to take this  reasons to take this   prenatal multivitamin Tabs tablet Take 1 tablet by mouth daily at 12 noon.   simethicone 80 MG chewable tablet Commonly known as:  MYLICON Chew 1 tablet (80 mg total) by mouth as needed for flatulence.       Cornelia Copa MD Attending [facility])

## 2017-02-14 ENCOUNTER — Encounter: Payer: Self-pay | Admitting: General Practice

## 2017-03-06 ENCOUNTER — Ambulatory Visit: Payer: Medicaid Other | Admitting: Certified Nurse Midwife

## 2017-03-09 ENCOUNTER — Ambulatory Visit: Payer: Medicaid Other | Admitting: Medical

## 2017-03-14 ENCOUNTER — Encounter: Payer: Self-pay | Admitting: Advanced Practice Midwife

## 2017-03-14 ENCOUNTER — Ambulatory Visit (INDEPENDENT_AMBULATORY_CARE_PROVIDER_SITE_OTHER): Payer: Medicaid Other | Admitting: Advanced Practice Midwife

## 2017-03-14 DIAGNOSIS — Z3042 Encounter for surveillance of injectable contraceptive: Secondary | ICD-10-CM | POA: Diagnosis present

## 2017-03-14 MED ORDER — MEDROXYPROGESTERONE ACETATE 150 MG/ML IM SUSP
150.0000 mg | Freq: Once | INTRAMUSCULAR | Status: AC
  Administered 2017-03-14: 150 mg via INTRAMUSCULAR

## 2017-03-14 MED ORDER — MEDROXYPROGESTERONE ACETATE 150 MG/ML IM SUSP: 150.0000 mg | Freq: Once | INTRAMUSCULAR | Status: DC

## 2017-03-14 NOTE — Progress Notes (Signed)
Subjective:     Toniqua V Blase MessHaynesworth is a 36 y.o. female who presents for a postpartum visit. She is 4 weeks postpartum following a low cervical transverse Cesarean section. I have fully reviewed the prenatal and intrapartum course. The delivery was at 32 gestational weeks. Outcome: primary cesarean section, low transverse incision. Anesthesia: epidural. Postpartum course has been uncomplicated. Baby's course has been complicated with preterm delivery and baby in NICU. He is progressing well. Baby is feeding by bottle - Similac Neosure. Bleeding no bleeding. Bowel function is normal. Bladder function is normal. Patient is sexually active. Contraception method is none. Postpartum depression screening: negative.    Review of Systems Pertinent items are noted in HPI.   Objective:    BP 109/67   Pulse 89   Ht 5\' 6"  (1.676 m)   Wt 121 lb 4.8 oz (55 kg)   Breastfeeding? No   BMI 19.58 kg/m   General:  alert, cooperative and appears stated age   Breasts:  inspection negative, no nipple discharge or bleeding, no masses or nodularity palpable  Lungs:    Heart:     Abdomen: soft, non-tender; bowel sounds normal; no masses,  no organomegaly   Vulva:  not evaluated  Vagina: not evaluated  Cervix:  deferred   Corpus: not examined  Adnexa:  not evaluated  Rectal Exam: Not performed.        Negative pregnancy test today   Assessment:     normal postpartum exam. Pap smear on 01/02/17 NIL with negative HPV.   Plan:    1. Contraception: Depo-Provera injections and with interval tubal   2. Follow up in: 12 months or as needed.   3. May return to Work on 03/27/17   Thressa ShellerHeather Artrice Kraker 4:17 PM 03/14/17

## 2017-03-14 NOTE — Patient Instructions (Addendum)
Patient may return to work on 03/27/17.   Thressa ShellerHeather Hogan  [facility] [phone] 4:27 PM 03/14/17   Due for depo September 18-October 2 if she has not had tubal by that date

## 2017-03-15 LAB — POCT PREGNANCY, URINE: Preg Test, Ur: NEGATIVE

## 2017-03-16 ENCOUNTER — Ambulatory Visit: Payer: Medicaid Other | Admitting: Obstetrics & Gynecology

## 2017-03-21 ENCOUNTER — Ambulatory Visit: Payer: Medicaid Other | Admitting: Obstetrics and Gynecology

## 2017-04-06 ENCOUNTER — Ambulatory Visit: Payer: Medicaid Other | Admitting: Obstetrics & Gynecology

## 2017-12-18 ENCOUNTER — Encounter (HOSPITAL_COMMUNITY): Payer: Self-pay

## 2017-12-18 ENCOUNTER — Emergency Department (HOSPITAL_COMMUNITY)
Admission: EM | Admit: 2017-12-18 | Discharge: 2017-12-18 | Disposition: A | Discharge status: Home / Self Care | Payer: Self-pay | Attending: Emergency Medicine | Admitting: Emergency Medicine

## 2017-12-18 ENCOUNTER — Other Ambulatory Visit: Payer: Self-pay

## 2017-12-18 DIAGNOSIS — F17200 Nicotine dependence, unspecified, uncomplicated: Secondary | ICD-10-CM | POA: Insufficient documentation

## 2017-12-18 DIAGNOSIS — K047 Periapical abscess without sinus: Secondary | ICD-10-CM | POA: Insufficient documentation

## 2017-12-18 DIAGNOSIS — K029 Dental caries, unspecified: Secondary | ICD-10-CM | POA: Insufficient documentation

## 2017-12-18 DIAGNOSIS — Z79899 Other long term (current) drug therapy: Secondary | ICD-10-CM | POA: Insufficient documentation

## 2017-12-18 MED ORDER — AMOXICILLIN 500 MG PO CAPS: 500.0000 mg | ORAL_CAPSULE | Freq: Three times a day (TID) | ORAL | 0 refills | Status: DC

## 2017-12-18 MED ORDER — TRAMADOL HCL 50 MG PO TABS: 50.0000 mg | ORAL_TABLET | Freq: Four times a day (QID) | ORAL | 0 refills | Status: DC | PRN

## 2017-12-18 MED ORDER — BUPIVACAINE-EPINEPHRINE (PF) 0.5% -1:200000 IJ SOLN
1.8000 mL | Freq: Once | INTRAMUSCULAR | Status: AC
  Administered 2017-12-18: 1.8 mL
  Filled 2017-12-18: qty 1.8

## 2017-12-18 NOTE — ED Provider Notes (Signed)
[facility] EMERGENCY DEPARTMENT Provider Note   CSN: [id number] Arrival date & time: 12/18/17  0137     History   Chief Complaint Chief Complaint  Patient presents with  . Dental Pain    HPI Destiny Schneider is a 37 y.o. female.  HPI 37 year old African-American female with no significant past medical history presents to the emergency department today for dental abscess.  Patient states that the pain of the left side of her jaw started earlier today.  She also noted some swelling that progressively worsened today.  Patient also states that she has what appears to be an abscess to the left upper gumline.  No drainage.  Patient denies any difficulty breathing or swallowing.  Denies any associated fevers.  Pain worse with palpation and range of motion.  Took Tylenol with no relief of her pain.  Patient has not followed up with a dentist. Past Medical History:  Diagnosis Date  . Anemia   . Hx of trichomoniasis 09/2016  . Preterm labor     Patient Active Problem List   Diagnosis Date Noted  . Postpartum care following cesarean delivery 02/11/2017  . Preterm premature rupture of membranes 02/09/2017  . Marijuana abuse 12/25/2016  . Anemia in pregnancy 12/25/2016  . Supervision of high risk pregnancy, antepartum 12/25/2016  . Late prenatal care affecting pregnancy in second trimester 12/15/2016  . AMA (advanced maternal age) multigravida 35+, second trimester 12/15/2016  . Short cervix during pregnancy in second trimester 12/13/2016  . Trichomonal vaginitis during pregnancy in second trimester 11/25/2016  . History of preterm delivery 11/25/2016    Past Surgical History:  Procedure Laterality Date  . CESAREAN SECTION N/A 02/11/2017   Procedure: CESAREAN SECTION;  Surgeon: Hermina Staggers, MD;  Location: [facility] BIRTHING SUITES;  Service: Obstetrics;  Laterality: N/A;  . WISDOM TOOTH EXTRACTION       OB History    Gravida  3   Para  2   Term      Preterm  2   AB  1   Living  2     SAB  1   TAB      Ectopic      Multiple  0   Live Births  2        Obstetric Comments  G1: PPROM and then stayed pregnant for about two weeks and then had delivery G2: patient states she had no PNC was 5 months and sounds like she had sudden sensation of having to push/low belly pressure. She came to the hospital here and had SVD with NN  death.          Home Medications    Prior to Admission medications   Medication Sig Start Date End Date Taking? Authorizing Provider  albuterol (PROVENTIL HFA;VENTOLIN HFA) 108 (90 BASE) MCG/ACT inhaler Inhale 1-2 puffs into the lungs every 6 (six) hours as needed for wheezing or shortness of breath.    [provider]  amoxicillin (AMOXIL) 500 MG capsule Take 1 capsule (500 mg total) by mouth 3 (three) times daily. 12/18/17   Rise Mu, PA-C  ferrous sulfate 325 (65 FE) MG EC tablet Take 325 mg by mouth 2 (two) times daily after a meal.    [provider]  ibuprofen (ADVIL,MOTRIN) 800 MG tablet Take 1 tablet (800 mg total) by mouth every 8 (eight) hours as needed. Patient not taking: Reported on 03/14/2017 02/13/17   [place] Bing, MD  oxyCODONE-acetaminophen (PERCOCET/ROXICET) 5-325 MG  tablet Take 1-2 tablets by mouth every 6 (six) hours as needed (pain scale 4-7). Patient not taking: Reported on 03/14/2017 02/13/17   [place] Bing, MD  polyethylene glycol (MIRALAX / GLYCOLAX) packet Take 17 g by mouth daily. Patient not taking: Reported on 03/14/2017 01/16/17   [place] Bing, MD  Prenatal Vit-Fe Fumarate-FA (PRENATAL MULTIVITAMIN) TABS tablet Take 1 tablet by mouth daily at 12 noon.    [provider]  simethicone (MYLICON) 80 MG chewable tablet Chew 1 tablet (80 mg total) by mouth as needed for flatulence. Patient not taking: Reported on 03/14/2017 02/13/17   [place] Bing, MD  traMADol (ULTRAM) 50 MG tablet Take 1 tablet (50 mg total) by mouth every 6 (six) hours as  needed. 12/18/17   Rise Mu, PA-C    Family History Family History  Problem Relation Age of Onset  . Varicose Veins Mother   . Alcohol abuse Father   . Drug abuse Father     Social History Social History   Tobacco Use  . Smoking status: Current Every Day Smoker    Packs/day: 0.50  . Smokeless tobacco: Never Used  Substance Use Topics  . Alcohol use: Yes    Alcohol/week: 0.6 oz    Types: 1 Shots of liquor per week  . Drug use: No     Allergies   Patient has no known allergies.   Review of Systems Review of Systems  All other systems reviewed and are negative.    Physical Exam Updated Vital Signs BP 124/81 (BP Location: Right Arm)   Pulse 73   Temp 97.6 F (36.4 C) (Oral)   Resp 18   Ht 5' 6.5" (1.689 m)   Wt 52.6 kg (116 lb)   LMP 11/21/2017   SpO2 100%   BMI 18.44 kg/m   Physical Exam  Constitutional: She appears well-developed and well-nourished. No distress.  HENT:  Head: Normocephalic and atraumatic.  Significant dental caries noted.  Patient also has 1 cm gross abscess to the left upper gumline with no drainage.  There is erythema noted.  Tender to palpation.  No trismus noted.  Uvula midline.  Oropharynx is clear.  Managing secretions tolerating airway.  No sublingual or submandibular swelling.  Mild left-sided facial swelling noted.  Eyes: Right eye exhibits no discharge. Left eye exhibits no discharge. No scleral icterus.  Neck: Normal range of motion.  Pulmonary/Chest: No respiratory distress.  Musculoskeletal: Normal range of motion.  Neurological: She is alert.  Skin: Skin is warm and dry. Capillary refill takes less than 2 seconds. No pallor.  Psychiatric: Her behavior is normal. Judgment and thought content normal.  Nursing note and vitals reviewed.    ED Treatments / Results  Labs (all labs ordered are listed, but only abnormal results are displayed) Labs Reviewed - No data to display  EKG None  Radiology No results  found.  Procedures .Marland KitchenIncision and Drainage Date/Time: 12/18/2017 6:11 AM Performed by: Rise Mu, PA-C Authorized by: Rise Mu, PA-C   Consent:    Consent obtained:  Verbal   Consent given by:  Patient   Risks discussed:  Bleeding, damage to other organs, infection, incomplete drainage and pain   Alternatives discussed:  No treatment Location:    Type:  Abscess   Size:  1   Location:  Mouth   Mouth location:  Alveolar process Anesthesia (see MAR for exact dosages):    Anesthesia method:  Nerve block   Block needle gauge:  27 G  Block anesthetic:  Bupivacaine 0.25% WITH epi   Block injection procedure:  Anatomic landmarks identified, introduced needle, incremental injection, negative aspiration for blood and anatomic landmarks palpated   Block outcome:  Anesthesia achieved Procedure type:    Complexity:  Simple Procedure details:    Incision types:  Stab incision   Drainage:  Purulent   Drainage amount:  Moderate   Wound treatment:  Wound left open   Packing materials:  None Post-procedure details:    Patient tolerance of procedure:  Tolerated well, no immediate complications   (including critical care time)  Medications Ordered in ED Medications  bupivacaine-epinephrine (MARCAINE W/ EPI) 0.5% -1:200000 injection 1.8 mL (1.8 mLs Infiltration Given 12/18/17 0517)     Initial Impression / Assessment and Plan / ED Course  I have reviewed the triage vital signs and the nursing notes.  Pertinent labs & imaging results that were available during my care of the patient were reviewed by me and considered in my medical decision making (see chart for details).     Patient with toothache.  Abscess noted and was drained with significant amount of purulent drainage.  Patient felt much improved after I&D.  Nerve block was performed..  Exam unconcerning for Ludwig's angina or spread of infection.  Will treat with amoxicillin and pain medicine.  Urged patient to  follow-up with dentist.     Final Clinical Impressions(s) / ED Diagnoses   Final diagnoses:  Dental abscess    ED Discharge Orders        Ordered    amoxicillin (AMOXIL) 500 MG capsule  3 times daily     12/18/17 0552    traMADol (ULTRAM) 50 MG tablet  Every 6 hours PRN     12/18/17 0552       Rise MuLeaphart, Alfhild Partch T, PA-C 12/18/17 [id number]    Zadie RhineWickline, Donald, MD 12/18/17 [phone]

## 2017-12-18 NOTE — ED Triage Notes (Signed)
Patient c/o left sided dental pain with swelling in left jaw that began earlier today. Took Tylenol for pain with no relief

## 2017-12-18 NOTE — Discharge Instructions (Addendum)
You have a dental injury/infection. It is very important that you get evaluated by a dentist as soon as possible. Call tomorrow to schedule an appointment. Ibuprofen and tylenol as needed for pain. Take your full course of antibiotics. Read the instructions below. Tramadol for pain which will make you drowsy so do not drive with it.   Eat a soft or liquid diet and rinse your mouth out after meals with warm water. You should see a dentist or return here at once if you have increased swelling, increased pain or uncontrolled bleeding from the site of your injury.  SEEK MEDICAL CARE IF:  You have increased pain not controlled with medicines.  You have swelling around your tooth, in your face or neck.  You have bleeding which starts, continues, or gets worse.  You have a fever >101 If you are unable to open your mouth

## 2017-12-18 NOTE — ED Notes (Signed)
Pt very upset about being in the hallway.  Explained delay for treatment rooms at this time.  She states she refuses to be evaluated in the hall.  Offered for patient to go back to waiting room and wait for available room and she states that she will stay where she is because she has already waited 3 hours and will tell the doctor that she refuses to be seen there when he comes.

## 2018-01-31 ENCOUNTER — Other Ambulatory Visit: Payer: Self-pay | Admitting: Otolaryngology

## 2018-07-08 ENCOUNTER — Emergency Department (HOSPITAL_COMMUNITY)
Admission: EM | Admit: 2018-07-08 | Discharge: 2018-07-09 | Disposition: A | Discharge status: Home / Self Care | Payer: Self-pay | Attending: Emergency Medicine | Admitting: Emergency Medicine

## 2018-07-08 ENCOUNTER — Other Ambulatory Visit: Payer: Self-pay

## 2018-07-08 ENCOUNTER — Encounter (HOSPITAL_COMMUNITY): Payer: Self-pay | Admitting: Emergency Medicine

## 2018-07-08 DIAGNOSIS — F17210 Nicotine dependence, cigarettes, uncomplicated: Secondary | ICD-10-CM | POA: Insufficient documentation

## 2018-07-08 DIAGNOSIS — Z79899 Other long term (current) drug therapy: Secondary | ICD-10-CM | POA: Insufficient documentation

## 2018-07-08 DIAGNOSIS — R1013 Epigastric pain: Secondary | ICD-10-CM | POA: Insufficient documentation

## 2018-07-08 LAB — LIPASE, BLOOD: LIPASE: 28 U/L (ref 11–51)

## 2018-07-08 LAB — URINALYSIS, ROUTINE W REFLEX MICROSCOPIC
Bilirubin Urine: NEGATIVE
Glucose, UA: NEGATIVE mg/dL
Hgb urine dipstick: NEGATIVE
KETONES UR: NEGATIVE mg/dL
Leukocytes, UA: NEGATIVE
NITRITE: NEGATIVE
PH: 6 (ref 5.0–8.0)
Protein, ur: NEGATIVE mg/dL
Specific Gravity, Urine: 1.025 (ref 1.005–1.030)

## 2018-07-08 LAB — COMPREHENSIVE METABOLIC PANEL
ALT: 29 U/L (ref 0–44)
AST: 37 U/L (ref 15–41)
Albumin: 4.1 g/dL (ref 3.5–5.0)
Alkaline Phosphatase: 56 U/L (ref 38–126)
Anion gap: 7 (ref 5–15)
BUN: 11 mg/dL (ref 6–20)
CHLORIDE: 109 mmol/L (ref 98–111)
CO2: 22 mmol/L (ref 22–32)
CREATININE: 0.76 mg/dL (ref 0.44–1.00)
Calcium: 8.7 mg/dL — ABNORMAL LOW (ref 8.9–10.3)
Glucose, Bld: 117 mg/dL — ABNORMAL HIGH (ref 70–99)
POTASSIUM: 3.5 mmol/L (ref 3.5–5.1)
Sodium: 138 mmol/L (ref 135–145)
Total Bilirubin: 0.6 mg/dL (ref 0.3–1.2)
Total Protein: 6.9 g/dL (ref 6.5–8.1)

## 2018-07-08 LAB — CBC
HEMATOCRIT: 34.7 % — AB (ref 36.0–46.0)
Hemoglobin: 11.3 g/dL — ABNORMAL LOW (ref 12.0–15.0)
MCH: 29.7 pg (ref 26.0–34.0)
MCHC: 32.6 g/dL (ref 30.0–36.0)
MCV: 91.3 fL (ref 80.0–100.0)
NRBC: 0 % (ref 0.0–0.2)
Platelets: 360 10*3/uL (ref 150–400)
RBC: 3.8 MIL/uL — ABNORMAL LOW (ref 3.87–5.11)
RDW: 14.4 % (ref 11.5–15.5)
WBC: 11.3 10*3/uL — AB (ref 4.0–10.5)

## 2018-07-08 LAB — I-STAT BETA HCG BLOOD, ED (MC, WL, AP ONLY)

## 2018-07-08 NOTE — ED Triage Notes (Signed)
C/ sharp pain across abd since 4pm with nausea and vomiting. LMP 1 week ago.

## 2018-07-09 ENCOUNTER — Emergency Department (HOSPITAL_COMMUNITY): Payer: Self-pay

## 2018-07-09 MED ORDER — MORPHINE SULFATE (PF) 4 MG/ML IV SOLN
4.0000 mg | Freq: Once | INTRAVENOUS | Status: AC
  Administered 2018-07-09: 4 mg via INTRAVENOUS
  Filled 2018-07-09: qty 1

## 2018-07-09 MED ORDER — PANTOPRAZOLE SODIUM 40 MG PO TBEC: 40.0000 mg | DELAYED_RELEASE_TABLET | Freq: Every day | ORAL | 0 refills | Status: DC

## 2018-07-09 MED ORDER — DICYCLOMINE HCL 10 MG/5ML PO SOLN
10.0000 mg | Freq: Once | ORAL | Status: AC
  Administered 2018-07-09: 10 mg via ORAL
  Filled 2018-07-09: qty 5

## 2018-07-09 MED ORDER — ALUM & MAG HYDROXIDE-SIMETH 200-200-20 MG/5ML PO SUSP
30.0000 mL | Freq: Once | ORAL | Status: AC
  Administered 2018-07-09: 30 mL via ORAL
  Filled 2018-07-09: qty 30

## 2018-07-09 MED ORDER — ONDANSETRON HCL 4 MG/2ML IJ SOLN
4.0000 mg | Freq: Once | INTRAMUSCULAR | Status: AC
  Administered 2018-07-09: 4 mg via INTRAVENOUS
  Filled 2018-07-09: qty 2

## 2018-07-09 MED ORDER — SODIUM CHLORIDE 0.9 % IV BOLUS
1000.0000 mL | Freq: Once | INTRAVENOUS | Status: AC
  Administered 2018-07-09: 1000 mL via INTRAVENOUS

## 2018-07-09 MED ORDER — MORPHINE SULFATE (PF) 4 MG/ML IV SOLN: 4.0000 mg | Freq: Once | INTRAVENOUS | Status: DC

## 2018-07-09 MED ORDER — PANTOPRAZOLE SODIUM 40 MG PO TBEC
40.0000 mg | DELAYED_RELEASE_TABLET | Freq: Once | ORAL | Status: AC
  Administered 2018-07-09: 40 mg via ORAL
  Filled 2018-07-09: qty 1

## 2018-07-09 NOTE — ED Notes (Signed)
Pt states she is having abd pains since 1600 yesterday.

## 2018-07-09 NOTE — ED Notes (Signed)
Patient transported to Ultrasound 

## 2018-07-09 NOTE — ED Provider Notes (Signed)
[facility] EMERGENCY DEPARTMENT Provider Note   CSN: [id number] Arrival date & time: 07/08/18  2233     History   Chief Complaint Chief Complaint  Patient presents with  . Abdominal Pain    HPI Destiny Schneider is a 37 y.o. female.  The history is provided by the patient.  Abdominal Pain    She had onset about 4 PM of epigastric pain which is gotten progressively worse.  There is been associated nausea and vomiting.  She has vomited 2 times.  Pain did not improve following vomiting.  She currently rates pain at 10/10.  There is no radiation of pain.  She is now having some chills, but denies fever or sweats.  She denies any diarrhea.  She had eaten some pinto beans prior to onset of pain.  Nothing makes it better, nothing makes it worse.  She has never had any pain like this before.  She has not done anything to treat it at home.  Past Medical History:  Diagnosis Date  . Anemia   . Hx of trichomoniasis 09/2016  . Preterm labor     Patient Active Problem List   Diagnosis Date Noted  . Postpartum care following cesarean delivery 02/11/2017  . Preterm premature rupture of membranes 02/09/2017  . Marijuana abuse 12/25/2016  . Anemia in pregnancy 12/25/2016  . Supervision of high risk pregnancy, antepartum 12/25/2016  . Late prenatal care affecting pregnancy in second trimester 12/15/2016  . AMA (advanced maternal age) multigravida 35+, second trimester 12/15/2016  . Short cervix during pregnancy in second trimester 12/13/2016  . Trichomonal vaginitis during pregnancy in second trimester 11/25/2016  . History of preterm delivery 11/25/2016    Past Surgical History:  Procedure Laterality Date  . CESAREAN SECTION N/A 02/11/2017   Procedure: CESAREAN SECTION;  Surgeon: Hermina Staggers, MD;  Location: [facility] BIRTHING SUITES;  Service: Obstetrics;  Laterality: N/A;  . WISDOM TOOTH EXTRACTION       OB History    Gravida  3   Para  2   Term      Preterm  2   AB  1   Living  2     SAB  1   TAB      Ectopic      Multiple  0   Live Births  2        Obstetric Comments  G1: PPROM and then stayed pregnant for about two weeks and then had delivery G2: patient states she had no PNC was 5 months and sounds like she had sudden sensation of having to push/low belly pressure. She came to the hospital here and had SVD with NN  death.          Home Medications    Prior to Admission medications   Medication Sig Start Date End Date Taking? Authorizing Provider  albuterol (PROVENTIL HFA;VENTOLIN HFA) 108 (90 BASE) MCG/ACT inhaler Inhale 1-2 puffs into the lungs every 6 (six) hours as needed for wheezing or shortness of breath.    [provider]  amoxicillin (AMOXIL) 500 MG capsule Take 1 capsule (500 mg total) by mouth 3 (three) times daily. 12/18/17   Rise Mu, PA-C  ferrous sulfate 325 (65 FE) MG EC tablet Take 325 mg by mouth 2 (two) times daily after a meal.    [provider]  ibuprofen (ADVIL,MOTRIN) 800 MG tablet Take 1 tablet (800 mg total) by mouth every 8 (eight) hours as needed. Patient  not taking: Reported on 03/14/2017 02/13/17   [place] Bing, MD  oxyCODONE-acetaminophen (PERCOCET/ROXICET) 5-325 MG tablet Take 1-2 tablets by mouth every 6 (six) hours as needed (pain scale 4-7). Patient not taking: Reported on 03/14/2017 02/13/17   [place] Bing, MD  polyethylene glycol (MIRALAX / GLYCOLAX) packet Take 17 g by mouth daily. Patient not taking: Reported on 03/14/2017 01/16/17   [place] Bing, MD  Prenatal Vit-Fe Fumarate-FA (PRENATAL MULTIVITAMIN) TABS tablet Take 1 tablet by mouth daily at 12 noon.    [provider]  simethicone (MYLICON) 80 MG chewable tablet Chew 1 tablet (80 mg total) by mouth as needed for flatulence. Patient not taking: Reported on 03/14/2017 02/13/17   [place] Bing, MD  traMADol (ULTRAM) 50 MG tablet Take 1 tablet (50 mg total) by mouth every 6 (six) hours as  needed. 12/18/17   Rise Mu, PA-C    Family History Family History  Problem Relation Age of Onset  . Varicose Veins Mother   . Alcohol abuse Father   . Drug abuse Father     Social History Social History   Tobacco Use  . Smoking status: Current Every Day Smoker    Packs/day: 0.50  . Smokeless tobacco: Never Used  Substance Use Topics  . Alcohol use: Yes    Alcohol/week: 1.0 standard drinks    Types: 1 Shots of liquor per week  . Drug use: No     Allergies   Patient has no known allergies.   Review of Systems Review of Systems  Gastrointestinal: Positive for abdominal pain.  All other systems reviewed and are negative.    Physical Exam Updated Vital Signs BP 110/71   Pulse 95   Temp 98.2 F (36.8 C) (Oral)   Resp 18   LMP 07/01/2018   SpO2 100%   Physical Exam  Nursing note and vitals reviewed.  37 year old female, appears to be in pain, but is in no acute distress. Vital signs are normal. Oxygen saturation is 100%, which is normal. Head is normocephalic and atraumatic. PERRLA, EOMI. Oropharynx is clear. Neck is nontender and supple without adenopathy or JVD. Back is nontender and there is no CVA tenderness. Lungs are clear without rales, wheezes, or rhonchi. Chest is nontender. Heart has regular rate and rhythm without murmur. Abdomen is soft, flat, with moderate epigastric and right upper quadrant tenderness with plus/minus Murphy sign.  There is no rebound or guarding.  There are no masses or hepatosplenomegaly and peristalsis is hypoactive. Extremities have no cyanosis or edema, full range of motion is present. Skin is warm and dry without rash. Neurologic: Mental status is normal, cranial nerves are intact, there are no motor or sensory deficits.  ED Treatments / Results  Labs (all labs ordered are listed, but only abnormal results are displayed) Labs Reviewed  COMPREHENSIVE METABOLIC PANEL - Abnormal; Notable for the following  components:      Result Value   Glucose, Bld 117 (*)    Calcium 8.7 (*)    All other components within normal limits  CBC - Abnormal; Notable for the following components:   WBC 11.3 (*)    RBC 3.80 (*)    Hemoglobin 11.3 (*)    HCT 34.7 (*)    All other components within normal limits  LIPASE, BLOOD  URINALYSIS, ROUTINE W REFLEX MICROSCOPIC  I-STAT BETA HCG BLOOD, ED ([facility], [facility], [facility] ONLY)   Radiology US Abdomen Limited  Result Date: 07/09/2018 CLINICAL DATA:  Abdominal pain and nausea  8 hours. EXAM: ULTRASOUND ABDOMEN LIMITED RIGHT UPPER QUADRANT COMPARISON:  None. FINDINGS: Gallbladder: No gallstones or wall thickening visualized. No sonographic Murphy sign noted by sonographer. Common bile duct: Diameter: 2.7 mm. Liver: No focal lesion identified. Within normal limits in parenchymal echogenicity. Portal vein is patent on color Doppler imaging with normal direction of blood flow towards the liver. IMPRESSION: Normal right upper quadrant ultrasound. Electronically Signed   By: Elberta Fortis M.D.   On: 07/09/2018 01:16    Procedures Procedures   Medications Ordered in ED Medications  sodium chloride 0.9 % bolus 1,000 mL (has no administration in time range)  morphine 4 MG/ML injection 4 mg (has no administration in time range)  ondansetron (ZOFRAN) injection 4 mg (has no administration in time range)     Initial Impression / Assessment and Plan / ED Course  I have reviewed the triage vital signs and the nursing notes.  Pertinent labs & imaging results that were available during my care of the patient were reviewed by me and considered in my medical decision making (see chart for details).  Abdominal pain of uncertain cause.  Location is suspicious for biliary colic, but consider gastritis, peptic ulcer disease, diverticulitis, pancreatitis.  Labs have been obtained prior to my seeing the patient, and she has a mild anemia, mild leukocytosis.  Lipase is normal as are her  transaminases and bilirubin and alkaline phosphatase.  She will be given IV fluids, morphine, ondansetron and will be sent for right upper quadrant ultrasound.  Old records are reviewed, and she is 16 months postpartum.  Renal stone CT study 2 years ago did not show evidence of cholelithiasis.  1:23 AM There is partial relief of pain with above-noted treatment.  Ultrasound shows no evidence of cholelithiasis.  Suspect gastritis or ulcer.  She will be given additional morphine as well as a GI cocktail and dose of pantoprazole.  3:07 AM She feels much better after above-noted treatment.  No evidence of serious pathology at this point.  She is discharged with prescription for pantoprazole, told to use over-the-counter antacids as needed.  Follow-up with PCP in 1 week.  Return precautions discussed.  Final Clinical Impressions(s) / ED Diagnoses   Final diagnoses:  Epigastric pain    ED Discharge Orders         Ordered    pantoprazole (PROTONIX) 40 MG tablet  Daily     07/09/18 0306           Dione Booze, MD 07/09/18 [phone]

## 2018-07-09 NOTE — Discharge Instructions (Addendum)
Take antacids as needed. Return if pain is getting  worse. °

## 2018-07-09 NOTE — ED Notes (Signed)
Patient verbalizes understanding of discharge instructions. Opportunity for questioning and answers were provided. Armband removed by staff, pt discharged from ED home via POV.  

## 2018-10-02 ENCOUNTER — Encounter (HOSPITAL_COMMUNITY): Payer: Self-pay | Admitting: *Deleted

## 2018-10-02 ENCOUNTER — Inpatient Hospital Stay (HOSPITAL_COMMUNITY)
Admission: AD | Admit: 2018-10-02 | Discharge: 2018-10-02 | Disposition: A | Discharge status: Home / Self Care | Payer: Self-pay | Attending: Obstetrics and Gynecology | Admitting: Obstetrics and Gynecology

## 2018-10-02 ENCOUNTER — Inpatient Hospital Stay (HOSPITAL_COMMUNITY): Payer: Self-pay

## 2018-10-02 ENCOUNTER — Other Ambulatory Visit: Payer: Self-pay

## 2018-10-02 DIAGNOSIS — O99331 Smoking (tobacco) complicating pregnancy, first trimester: Secondary | ICD-10-CM | POA: Insufficient documentation

## 2018-10-02 DIAGNOSIS — O200 Threatened abortion: Secondary | ICD-10-CM | POA: Insufficient documentation

## 2018-10-02 DIAGNOSIS — O26891 Other specified pregnancy related conditions, first trimester: Secondary | ICD-10-CM

## 2018-10-02 DIAGNOSIS — R109 Unspecified abdominal pain: Secondary | ICD-10-CM

## 2018-10-02 DIAGNOSIS — Z3A08 8 weeks gestation of pregnancy: Secondary | ICD-10-CM | POA: Insufficient documentation

## 2018-10-02 DIAGNOSIS — F17210 Nicotine dependence, cigarettes, uncomplicated: Secondary | ICD-10-CM | POA: Insufficient documentation

## 2018-10-02 DIAGNOSIS — O209 Hemorrhage in early pregnancy, unspecified: Secondary | ICD-10-CM

## 2018-10-02 HISTORY — DX: Calculus of kidney: N20.0

## 2018-10-02 LAB — URINALYSIS, ROUTINE W REFLEX MICROSCOPIC
BACTERIA UA: NONE SEEN
Glucose, UA: NEGATIVE mg/dL
Ketones, ur: 5 mg/dL — AB
LEUKOCYTES UA: NEGATIVE
NITRITE: NEGATIVE
Protein, ur: 30 mg/dL — AB
SPECIFIC GRAVITY, URINE: 1.03 (ref 1.005–1.030)
pH: 7 (ref 5.0–8.0)

## 2018-10-02 LAB — CBC
HCT: 31.3 % — ABNORMAL LOW (ref 36.0–46.0)
HEMOGLOBIN: 10.5 g/dL — AB (ref 12.0–15.0)
MCH: 30.7 pg (ref 26.0–34.0)
MCHC: 33.5 g/dL (ref 30.0–36.0)
MCV: 91.5 fL (ref 80.0–100.0)
Platelets: 300 10*3/uL (ref 150–400)
RBC: 3.42 MIL/uL — ABNORMAL LOW (ref 3.87–5.11)
RDW: 14.5 % (ref 11.5–15.5)
WBC: 3.9 10*3/uL — ABNORMAL LOW (ref 4.0–10.5)
nRBC: 0 % (ref 0.0–0.2)

## 2018-10-02 LAB — ABO/RH: ABO/RH(D): A POS

## 2018-10-02 LAB — WET PREP, GENITAL
SPERM: NONE SEEN
Trich, Wet Prep: NONE SEEN
Yeast Wet Prep HPF POC: NONE SEEN

## 2018-10-02 LAB — POCT PREGNANCY, URINE: Preg Test, Ur: POSITIVE — AB

## 2018-10-02 LAB — HCG, QUANTITATIVE, PREGNANCY: hCG, Beta Chain, Quant, S: 2792 m[IU]/mL — ABNORMAL HIGH (ref <5)

## 2018-10-02 NOTE — MAU Provider Note (Signed)
Chief Complaint: Vaginal Bleeding; Abdominal Pain; and Possible Pregnancy   First Provider Initiated Contact with Patient 10/02/18 1044     SUBJECTIVE HPI: Destiny Schneider is a 38 y.o. [id number] at 5671w3d who presents to Maternity Admissions reporting abdominal pain and vaginal bleeding. Had positive HPT 2 weeks ago. Has not been seen for care yet.  Symptoms started yesterday. Had bad abdominal cramping that felt like contractions. Then had bright red blood in her underwear. Took a shower and had some small clots on her washcloth. Today has had some continued bleeding that is now brown.  Denies n/v/d, constipation, dysuria, vaginal discharge, or recent intercourse.   Location: abdomen Quality: cramping, contraction Severity: 3/10 on pain scale Duration: 2 days Timing: intermittent Modifying factors: none Associated signs and symptoms: vaginal bleeding  Past Medical History:  Diagnosis Date  . Anemia   . Hx of trichomoniasis 09/2016  . Kidney stones   . Preterm labor    OB History  Gravida Para Term Preterm AB Living  4 2   2 1 2   SAB TAB Ectopic Multiple Live Births  1     0 2    # Outcome Date GA Lbr Len/2nd Weight Sex Delivery Anes PTL Lv  4 Current           3 Preterm 02/11/17 4153w3d  1940 g M CS-LTranv EPI  LIV  2 SAB 1999 2253w0d         1 Preterm 1997 1624w0d   F Vag-Spont EPI Y LIV    Obstetric Comments  G1: PPROM and then stayed pregnant for about two weeks and then had delivery  G2: patient states she had no PNC was 5 months and sounds like she had sudden sensation of having to push/low belly pressure. She came to the hospital here and had SVD with NN death.    Past Surgical History:  Procedure Laterality Date  . CESAREAN SECTION N/A 02/11/2017   Procedure: CESAREAN SECTION;  Surgeon: Hermina StaggersErvin, Michael L, MD;  [facility];  Service: Obstetrics;  Laterality: N/A;  . WISDOM TOOTH EXTRACTION     Social History   Socioeconomic History  . Marital status:  Single    Spouse name: Not on file  . Number of children: Not on file  . Years of education: Not on file  . Highest education level: Not on file  Occupational History  . Not on file  Social Needs  . Financial resource strain: Not on file  . Food insecurity:    Worry: Not on file    Inability: Not on file  . Transportation needs:    Medical: Not on file    Non-medical: Not on file  Tobacco Use  . Smoking status: Current Every Day Smoker    Packs/day: 0.25    Types: Cigarettes  . Smokeless tobacco: Never Used  Substance and Sexual Activity  . Alcohol use: Yes    Alcohol/week: 1.0 standard drinks    Types: 1 Shots of liquor per week  . Drug use: No  . Sexual activity: Yes    Birth control/protection: None  Lifestyle  . Physical activity:    Days per week: Not on file    Minutes per session: Not on file  . Stress: Not on file  Relationships  . Social connections:    Talks on phone: Not on file    Gets together: Not on file    Attends religious service: Not on file    Active member  of club or organization: Not on file    Attends meetings of clubs or organizations: Not on file    Relationship status: Not on file  . Intimate partner violence:    Fear of current or ex partner: Not on file    Emotionally abused: Not on file    Physically abused: Not on file    Forced sexual activity: Not on file  Other Topics Concern  . Not on file  Social History Narrative  . Not on file   Family History  Problem Relation Age of Onset  . Varicose Veins Mother   . Alcohol abuse Father   . Drug abuse Father    No current facility-administered medications on file prior to encounter.    Current Outpatient Medications on File Prior to Encounter  Medication Sig Dispense Refill  . albuterol (PROVENTIL HFA;VENTOLIN HFA) 108 (90 BASE) MCG/ACT inhaler Inhale 1-2 puffs into the lungs every 6 (six) hours as needed for wheezing or shortness of breath.    . ferrous sulfate 325 (65 FE) MG EC  tablet Take 325 mg by mouth 2 (two) times daily after a meal.    . pantoprazole (PROTONIX) 40 MG tablet Take 1 tablet (40 mg total) by mouth daily. 30 tablet 0  . Prenatal Vit-Fe Fumarate-FA (PRENATAL MULTIVITAMIN) TABS tablet Take 1 tablet by mouth daily at 12 noon.     No Known Allergies  I have reviewed patient's Past Medical Hx, Surgical Hx, Family Hx, Social Hx, medications and allergies.   Review of Systems  Constitutional: Negative.   Gastrointestinal: Positive for abdominal pain. Negative for constipation, diarrhea, nausea and vomiting.  Genitourinary: Positive for vaginal discharge. Negative for dysuria and vaginal bleeding.    OBJECTIVE Patient Vitals for the past 24 hrs:  BP Temp Temp src Pulse Resp SpO2 Height Weight  10/02/18 1323 121/66 - - 74 - - - -  10/02/18 1322 - - - - 16 - - -  10/02/18 1005 120/69 98.1 F (36.7 C) Oral 85 18 100 % 5' 6.5" (1.689 m) 56.2 kg   Constitutional: Well-developed, well-nourished female in no acute distress.  Cardiovascular: normal rate & rhythm, no murmur Respiratory: normal rate and effort. Lung sounds clear throughout GI: Abd soft, non-tender, Pos BS x 4. No guarding or rebound tenderness MS: Extremities nontender, no edema, normal ROM Neurologic: Alert and oriented x 4.  GU:     SPECULUM EXAM: NEFG, small amount of dark red blood. Cervix not friable.   BIMANUAL: No CMT. cervix closed; uterus normal size, no adnexal tenderness or masses.    LAB RESULTS Results for orders placed or performed during the hospital encounter of 10/02/18 (from the past 24 hour(s))  Urinalysis, Routine w reflex microscopic     Status: Abnormal   Collection Time: 10/02/18 10:14 AM  Result Value Ref Range   Color, Urine YELLOW YELLOW   APPearance CLEAR CLEAR   Specific Gravity, Urine 1.030 1.005 - 1.030   pH 7.0 5.0 - 8.0   Glucose, UA NEGATIVE NEGATIVE mg/dL   Hgb urine dipstick SMALL (A) NEGATIVE   Bilirubin Urine SMALL (A) NEGATIVE   Ketones, ur 5  (A) NEGATIVE mg/dL   Protein, ur 30 (A) NEGATIVE mg/dL   Nitrite NEGATIVE NEGATIVE   Leukocytes, UA NEGATIVE NEGATIVE   RBC / HPF 0-5 0 - 5 RBC/hpf   WBC, UA 0-5 0 - 5 WBC/hpf   Bacteria, UA NONE SEEN NONE SEEN   Squamous Epithelial / LPF 0-5 0 -  5   Mucus PRESENT   Pregnancy, urine POC     Status: Abnormal   Collection Time: 10/02/18 10:18 AM  Result Value Ref Range   Preg Test, Ur POSITIVE (A) NEGATIVE  Wet prep, genital     Status: Abnormal   Collection Time: 10/02/18 11:01 AM  Result Value Ref Range   Yeast Wet Prep HPF POC NONE SEEN NONE SEEN   Trich, Wet Prep NONE SEEN NONE SEEN   Clue Cells Wet Prep HPF POC PRESENT (A) NONE SEEN   WBC, Wet Prep HPF POC MODERATE (A) NONE SEEN   Sperm NONE SEEN   CBC     Status: Abnormal   Collection Time: 10/02/18 11:23 AM  Result Value Ref Range   WBC 3.9 (L) 4.0 - 10.5 K/uL   RBC 3.42 (L) 3.87 - 5.11 MIL/uL   Hemoglobin 10.5 (L) 12.0 - 15.0 g/dL   HCT 16.1 (L) 09.6 - 04.5 %   MCV 91.5 80.0 - 100.0 fL   MCH 30.7 26.0 - 34.0 pg   MCHC 33.5 30.0 - 36.0 g/dL   RDW 40.9 81.1 - 91.4 %   Platelets 300 150 - 400 K/uL   nRBC 0.0 0.0 - 0.2 %  ABO/Rh     Status: None   Collection Time: 10/02/18 11:23 AM  Result Value Ref Range   ABO/RH(D)      A POS Performed at [facility], [address], [place], [place] [address]   hCG, quantitative, pregnancy     Status: Abnormal   Collection Time: 10/02/18 11:23 AM  Result Value Ref Range   hCG, Beta Chain, Quant, S 2,792 (H) <5 mIU/mL    IMAGING US Ob Less Than 14 Weeks With Ob Transvaginal  Result Date: 10/02/2018 CLINICAL DATA:  Abdominal pain and vaginal bleeding in 1st trimester pregnancy. Gestational age by LMP of 8 weeks 3 days. EXAM: OBSTETRIC <14 WK [place] AND TRANSVAGINAL OB US TECHNIQUE: Both transabdominal and transvaginal ultrasound examinations were performed for complete evaluation of the gestation as well as the maternal uterus, adnexal regions, and pelvic cul-de-sac. Transvaginal  technique was performed to assess early pregnancy. COMPARISON:  None. FINDINGS: Intrauterine gestational sac: Single; irregular shape and thin decidual reaction noted Yolk sac:  Not Visualized. Embryo:  Not Visualized. MSD: 8 mm   5 w   3 d Subchorionic hemorrhage:  None visualized. Maternal uterus/adnexae: Retroflexed uterus. Both ovaries are normal in appearance. No mass or abnormal free fluid identified. IMPRESSION: Single early intrauterine gestational sac measuring 5 weeks 3 days. Irregular sac shape and thin decidual reaction or poor prognostic signs. Suggest correlation with serial b-hCG levels, and consider followup ultrasound to assess viability in 10-14 days. Electronically Signed   By: Myles Rosenthal M.D.   On: 10/02/2018 12:49    MAU COURSE Orders Placed This Encounter  Procedures  . Wet prep, genital  . US OB LESS THAN 14 WEEKS WITH OB TRANSVAGINAL  . US OB Transvaginal  . Urinalysis, Routine w reflex microscopic  . CBC  . hCG, quantitative, pregnancy  . Pregnancy, urine POC  . ABO/Rh  . Discharge patient   No orders of the defined types were placed in this encounter.   MDM +UPT UA, wet prep, GC/chlamydia, CBC, ABO/Rh, quant hCG, and [place] today to rule out ectopic pregnancy RH positive  Ultrasound shows IUGS with decidual reaction & no adnexal masses. Reviewed images with Dr. Alysia Penna. Recommends f/u ultrasound in 7-10.  ASSESSMENT 1. Threatened miscarriage   2.  Vaginal bleeding in pregnancy, first trimester   3. Abdominal pain during pregnancy in first trimester     PLAN Discharge home in stable condition. SAB precautions Outpatient ultrasound ordered  Follow-up Information    [facility].   Specialty:  Radiology Contact information: [address] [id number] [place] [id number]         Allergies as of 10/02/2018   No Known Allergies     Medication List    TAKE these medications    albuterol 108 (90 Base) MCG/ACT inhaler Commonly known as:  PROVENTIL HFA;VENTOLIN HFA Inhale 1-2 puffs into the lungs every 6 (six) hours as needed for wheezing or shortness of breath.   ferrous sulfate 325 (65 FE) MG EC tablet Take 325 mg by mouth 2 (two) times daily after a meal.   pantoprazole 40 MG tablet Commonly known as:  PROTONIX Take 1 tablet (40 mg total) by mouth daily.   prenatal multivitamin Tabs tablet Take 1 tablet by mouth daily at 12 noon.        Judeth HornLawrence, Adorian Gwynne, NP 10/02/2018  5:44 PM

## 2018-10-02 NOTE — Discharge Instructions (Signed)
Threatened Miscarriage  A threatened miscarriage occurs when a woman has vaginal bleeding during the first 20 weeks of pregnancy but the pregnancy has not ended. If you have vaginal bleeding during this time, your health care provider will do tests to make sure you are still pregnant. If the tests show that you are still pregnant and that the developing baby (fetus) inside your uterus is still growing, your condition is considered a threatened miscarriage. A threatened miscarriage does not mean your pregnancy will end, but it does increase the risk of losing your pregnancy (complete miscarriage). What are the causes? The cause of this condition is usually not known. For women who go on to have a complete miscarriage, the most common cause is an abnormal number of chromosomes in the developing baby. Chromosomes are the structures inside cells that hold all of a person's genetic material. What increases the risk? The following lifestyle factors may increase your risk of a miscarriage in early pregnancy:  Smoking.  Drinking excessive amounts of alcohol or caffeine.  Recreational drug use. The following preexisting health conditions may increase your risk of a miscarriage in early pregnancy:  Polycystic ovary syndrome.  Uterine fibroids.  Infections.  Diabetes mellitus. What are the signs or symptoms? Symptoms of this condition include:  Vaginal bleeding.  Mild abdominal pain or cramps. How is this diagnosed? If you have bleeding with or without abdominal pain before 20 weeks of pregnancy, your health care provider will do tests to check whether you are still pregnant. These will include:  Ultrasound. This test uses sound waves to create images of the inside of your uterus. This allows your health care provider to look at your developing baby and other structures, such as your placenta.  Pelvic exam. This is an internal exam of your vagina and cervix.  Measurement of your baby's heart  rate.  Laboratory tests such as blood tests, urine tests, or swabs for infection You may be diagnosed with a threatened miscarriage if:  Ultrasound testing shows that you are still pregnant.  Your baby's heart rate is strong.  A pelvic exam shows that the opening between your uterus and your vagina (cervix) is closed.  Blood tests confirm that you are still pregnant. How is this treated? No treatments have been shown to prevent a threatened miscarriage from going on to a complete miscarriage. However, the right home care is important. Follow these instructions at home:  Get plenty of rest.  Do not have sex or use tampons if you have vaginal bleeding.  Do not douche.  Do not smoke or use recreational drugs.  Do not drink alcohol.  Avoid caffeine.  Keep all follow-up prenatal visits as told by your health care provider. This is important. Contact a health care provider if:  You have light vaginal bleeding or spotting while pregnant.  You have abdominal pain or cramping.  You have a fever. Get help right away if:  You have heavy vaginal bleeding.  You have blood clots coming from your vagina.  You pass tissue from your vagina.  You leak fluid, or you have a gush of fluid from your vagina.  You have severe low back pain or abdominal cramps.  You have fever, chills, and severe abdominal pain. Summary  A threatened miscarriage occurs when a woman has vaginal bleeding during the first 20 weeks of pregnancy but the pregnancy has not ended.  The cause of a threatened miscarriage is usually not known.  Symptoms of this condition may   include vaginal bleeding and mild abdominal pain or cramps.  No treatments have been shown to prevent a threatened miscarriage from going on to a complete miscarriage.  Keep all follow-up prenatal visits as told by your health care provider. This is important. This information is not intended to replace advice given to you by your health  care provider. Make sure you discuss any questions you have with your health care provider. Document Released: 08/29/2005 Document Revised: 11/25/2016 Document Reviewed: 11/25/2016 Elsevier Interactive Patient Education  2019 Elsevier Inc.  

## 2018-10-02 NOTE — MAU Note (Signed)
2 + wks ago, had +HPT. Started cramping, almost contraction like pain yesterday.  Started bleeding.  Last night noted 3 or 4 tiny little clots. This morning, went to work. Started cramping again, blood is darker - more brown this morning.

## 2018-10-03 LAB — GC/CHLAMYDIA PROBE AMP (~~LOC~~) NOT AT ARMC
Chlamydia: NEGATIVE
Neisseria Gonorrhea: NEGATIVE

## 2018-10-10 ENCOUNTER — Ambulatory Visit (HOSPITAL_COMMUNITY): Payer: Medicaid Other

## 2019-03-08 ENCOUNTER — Telehealth: Payer: Self-pay | Admitting: Family Medicine

## 2019-03-08 NOTE — Telephone Encounter (Signed)
Called the patient to confirm the visit. Explained how it works and how to access the app. Informed the patient of how to download the mychart app. She stated she is at work and will download once she gets off. The patient was sent a link via text.

## 2019-03-11 ENCOUNTER — Telehealth: Payer: Self-pay | Admitting: Advanced Practice Midwife

## 2019-03-11 ENCOUNTER — Other Ambulatory Visit: Payer: Self-pay

## 2019-03-11 ENCOUNTER — Telehealth (INDEPENDENT_AMBULATORY_CARE_PROVIDER_SITE_OTHER): Payer: Medicaid Other | Admitting: Advanced Practice Midwife

## 2019-03-11 ENCOUNTER — Encounter: Payer: Self-pay | Admitting: Advanced Practice Midwife

## 2019-03-11 DIAGNOSIS — Z3009 Encounter for other general counseling and advice on contraception: Secondary | ICD-10-CM

## 2019-03-11 NOTE — Progress Notes (Signed)
I connected with  Destiny Schneider on 03/11/19 at  8:35 AM EDT by telephone and verified that I am speaking with the correct person using two identifiers.   I discussed the limitations, risks, security and privacy concerns of performing an evaluation and management service by telephone and the availability of in person appointments. I also discussed with the patient that there may be a patient responsible charge related to this service. The patient expressed understanding and agreed to proceed.  [name], [place] 03/11/2019  8:31 AM   Patient has a miscarriage in Jan 2020. Considering depo provera,  States that it makes her spot and bleeding. Wants to know her options. States she is having trouble downloading the Mychart App on her telephone.

## 2019-03-11 NOTE — Progress Notes (Signed)
   TELEHEALTH VIRTUAL GYNECOLOGY VISIT ENCOUNTER NOTE  I connected with Destiny Schneider on 03/11/19 at  8:35 AM EDT by telephone at home and verified that I am speaking with the correct person using two identifiers.   I discussed the limitations, risks, security and privacy concerns of performing an evaluation and management service by telephone and the availability of in person appointments. I also discussed with the patient that there may be a patient responsible charge related to this service. The patient expressed understanding and agreed to proceed.   History:  Destiny Schneider is a 38 y.o. [phone] female being evaluated today for initiation of birth control. She denies any abnormal vaginal discharge, bleeding, pelvic pain or other concerns.     She states that she is interested in Depo, and has used this in the past. She has only done the initial 3 months and then stopped using it due to spotting.    Past Medical History:  Diagnosis Date  . Anemia   . Hx of trichomoniasis 09/2016  . Kidney stones   . Preterm labor    Past Surgical History:  Procedure Laterality Date  . CESAREAN SECTION N/A 02/11/2017   Procedure: CESAREAN SECTION;  Surgeon: Chancy Milroy, MD;  Location: [place];  Service: Obstetrics;  Laterality: N/A;  . WISDOM TOOTH EXTRACTION     The following portions of the patient's history were reviewed and updated as appropriate: allergies, current medications, past family history, past medical history, past social history, past surgical history and problem list.   Health Maintenance:  Normal pap and negative HRHPV on 12/2016.  Normal mammogram on NA, age.   Review of Systems:  Pertinent items noted in HPI and remainder of comprehensive ROS otherwise negative.  Physical Exam:   General:  Alert, oriented and cooperative.   Mental Status: Normal mood and affect perceived. Normal judgment and thought content.  Physical exam deferred due to nature of  the encounter  Reviewed with the patient all forms of birth control options available including abstinence; over the counter/barrier methods; hormonal contraceptive medication including pill, patch, ring, Depo-Provera injection, Nexplanon; Mirena/Liletta and Paragard IUDs. Risks and benefits reviewed.  Questions were answered.  Information was given to patient to review.    Labs and Imaging No results found for this or any previous visit (from the past 336 hour(s)). No results found.    Assessment and Plan:     1. General counselling and advice on contraception - Patient would like to proceed with Depo - Message sent to front Desk - Due for annual with pap and mammogram in 2021       I discussed the assessment and treatment plan with the patient. The patient was provided an opportunity to ask questions and all were answered. The patient agreed with the plan and demonstrated an understanding of the instructions.   The patient was advised to call back or seek an in-person evaluation/go to the ED if the symptoms worsen or if the condition fails to improve as anticipated.  I provided 10 minutes of non-face-to-face time during this encounter.  Marcille Buffy DNP, CNM  03/11/19  8:48 AM  [facility]

## 2019-03-11 NOTE — Telephone Encounter (Signed)
Attempted to call patient with her depo injection appointment ( 7/6 @ 1:30). No answer, left appointment information on voicemail as well as office number if needing to reschedule. Appointment reminder mailed.

## 2019-03-14 ENCOUNTER — Telehealth: Payer: Self-pay | Admitting: Obstetrics and Gynecology

## 2019-03-14 NOTE — Telephone Encounter (Signed)
Called the patient to pre-screen. Left a detailed voicemail informing if the patient is experiencing any flu-like symptoms such as fever, chills, cough, or shortness of breath and/or has been in contact with anyone who is suspected of/or confirmed to have COVID19 please call back to reschedule the appointment. Upon entering the office please wear a face mask, sanitize hands, and no visitors or children due to COVID19 restrictions. °

## 2019-03-18 ENCOUNTER — Ambulatory Visit: Payer: Medicaid Other

## 2019-04-23 ENCOUNTER — Other Ambulatory Visit: Payer: Self-pay

## 2019-04-23 DIAGNOSIS — Z20822 Contact with and (suspected) exposure to covid-19: Secondary | ICD-10-CM

## 2019-04-24 LAB — NOVEL CORONAVIRUS, NAA: SARS-CoV-2, NAA: NOT DETECTED

## 2019-09-16 IMAGING — US US ABDOMEN LIMITED
1 series · 14 of 25 positions shown · non-contrast
Comparison: None.

CLINICAL DATA: Abdominal pain and nausea 8 hours.

EXAM:
ULTRASOUND ABDOMEN LIMITED RIGHT UPPER QUADRANT

[Series 1: us abdomen limited · 0.19mm/px · 14 of 57 slices shown]
[im 1/57]
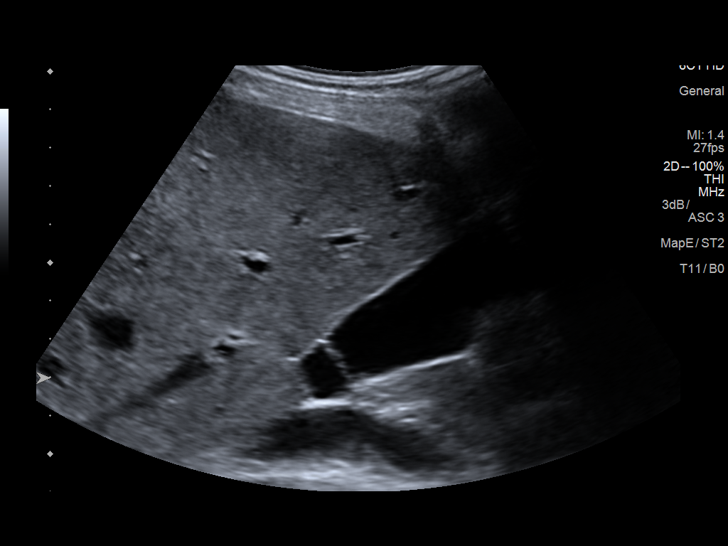
[im 5/57]
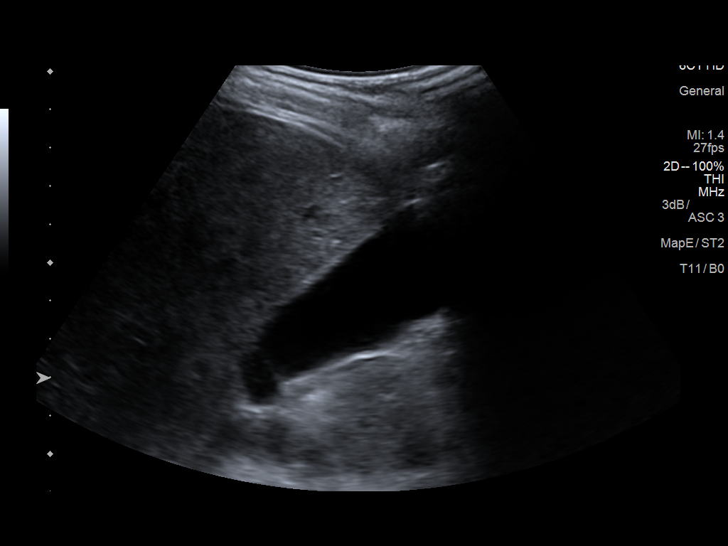
[im 10/57]
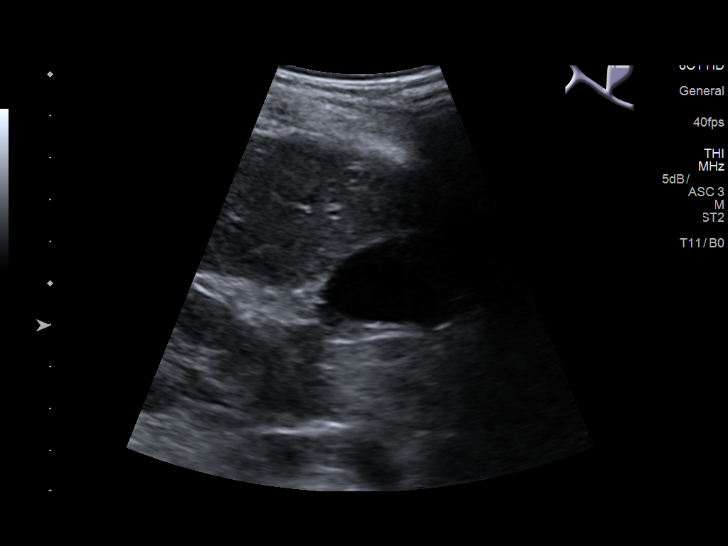
[im 15/57]
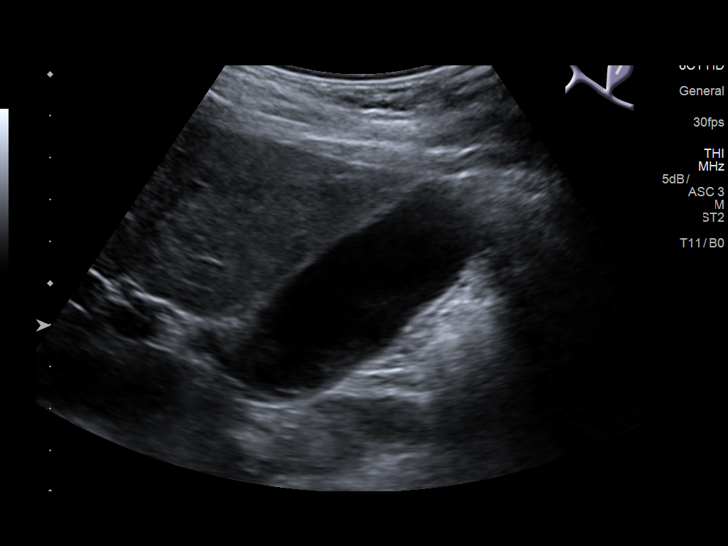
[im 19/57]
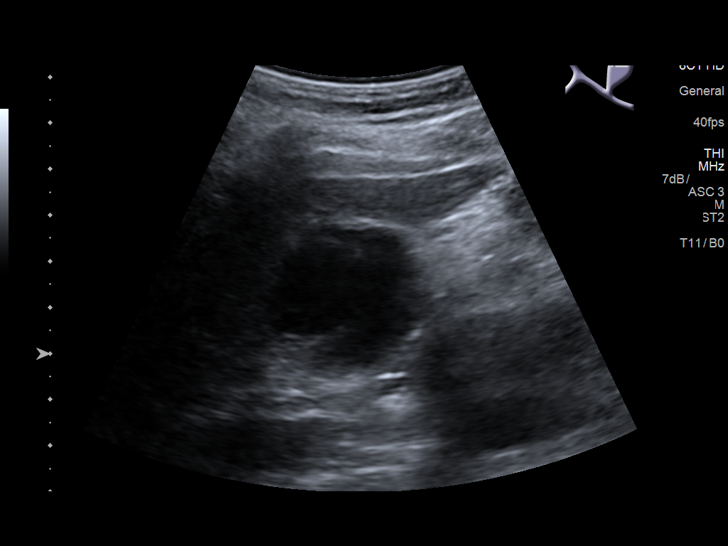
[im 22/57]
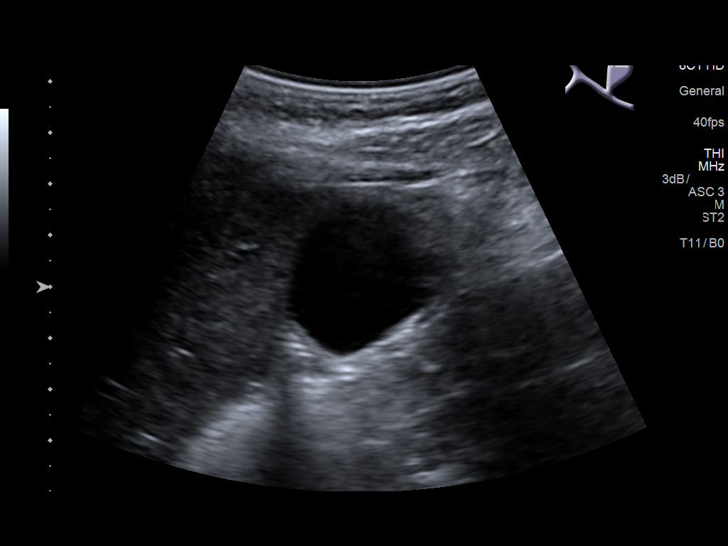
[im 26/57]
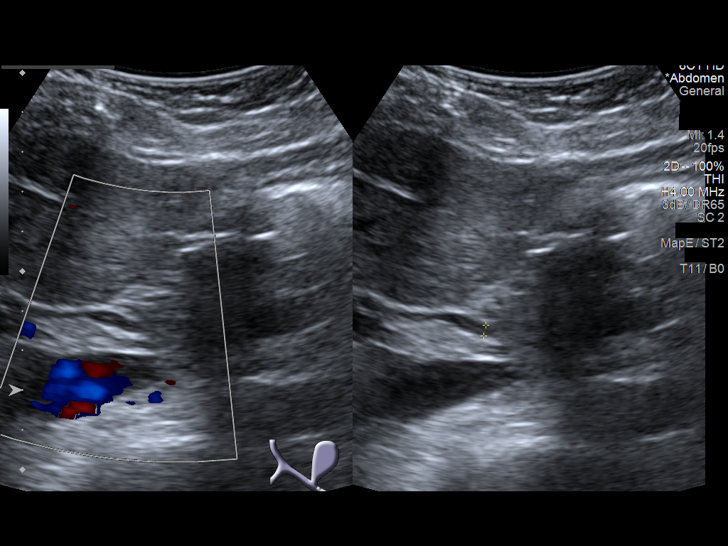
[im 31/57]
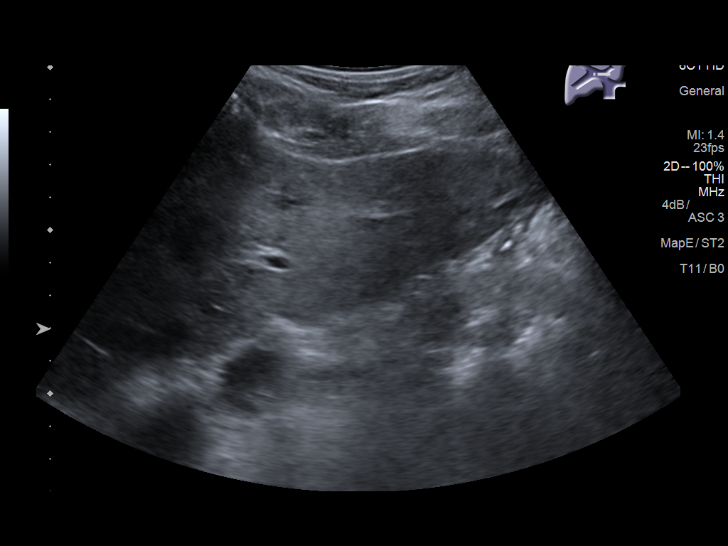
[im 36/57]
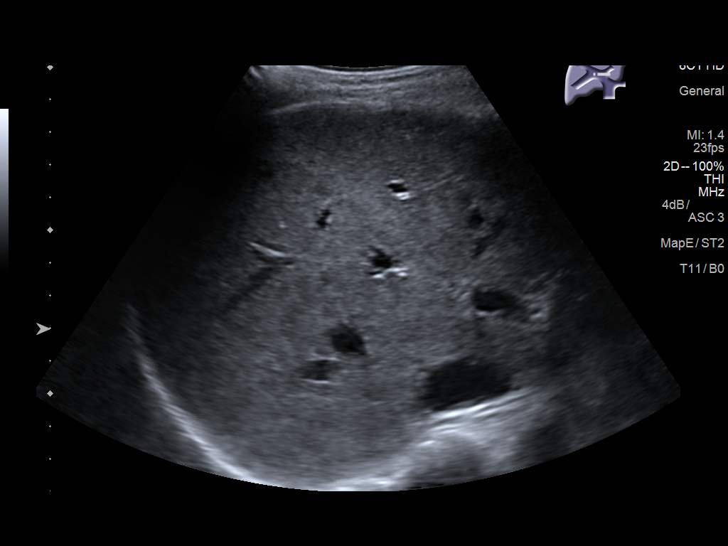
[im 38/57]
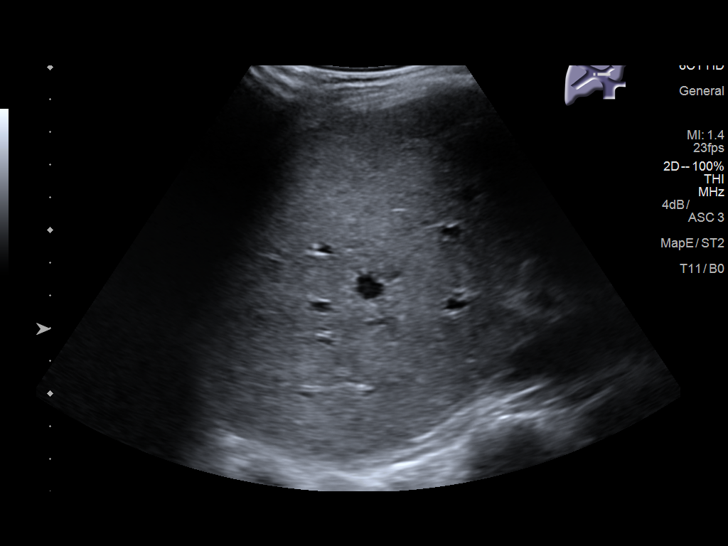
[im 43/57]
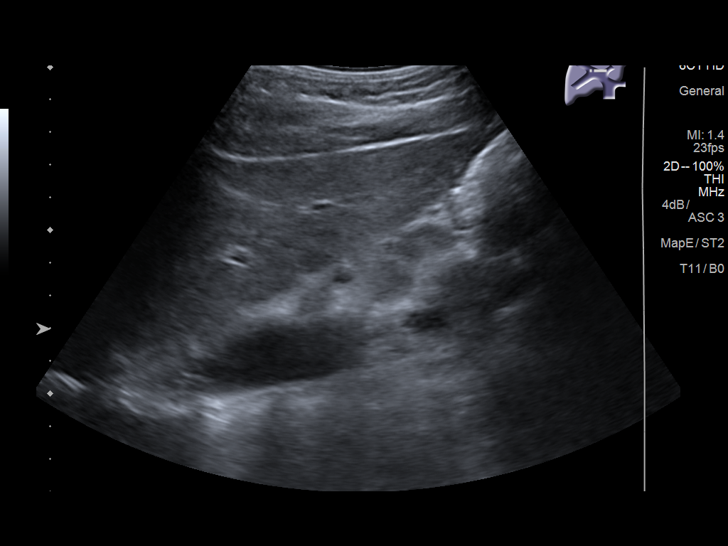
[im 47/57]
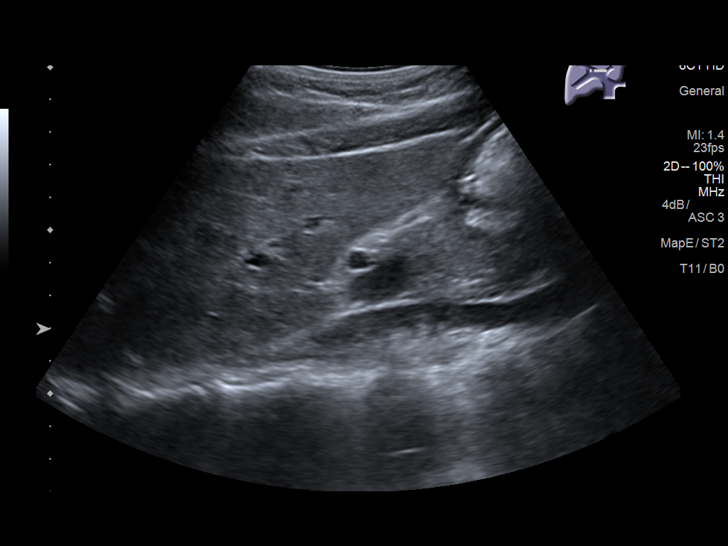
[im 52/57]
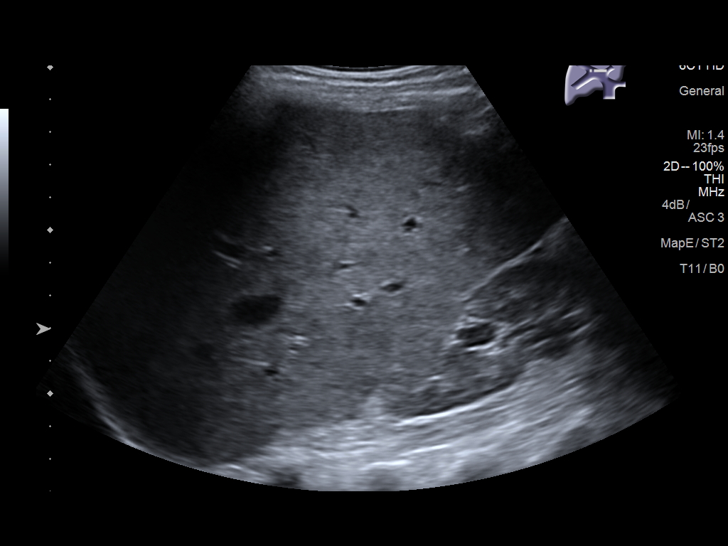
[im 57/57]
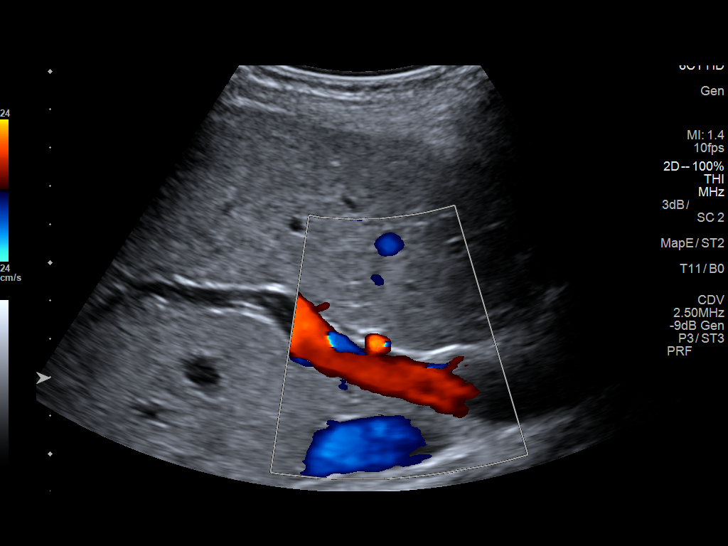

[14 of 25 positions shown; findings below may reference images not displayed]

FINDINGS: Gallbladder:

No gallstones or wall thickening visualized. No sonographic Murphy
sign noted by sonographer.

Common bile duct:

Diameter: 2.7 mm.

Liver:

No focal lesion identified. Within normal limits in parenchymal
echogenicity. Portal vein is patent on color Doppler imaging with
normal direction of blood flow towards the liver.
IMPRESSION: Normal right upper quadrant ultrasound.

## 2020-02-04 ENCOUNTER — Emergency Department (HOSPITAL_COMMUNITY)
Admission: EM | Admit: 2020-02-04 | Discharge: 2020-02-04 | Disposition: A | Discharge status: Home / Self Care | Payer: Medicaid Other | Attending: Emergency Medicine | Admitting: Emergency Medicine

## 2020-02-04 ENCOUNTER — Encounter (HOSPITAL_COMMUNITY): Payer: Self-pay | Admitting: Emergency Medicine

## 2020-02-04 ENCOUNTER — Other Ambulatory Visit: Payer: Self-pay

## 2020-02-04 DIAGNOSIS — Z20822 Contact with and (suspected) exposure to covid-19: Secondary | ICD-10-CM | POA: Insufficient documentation

## 2020-02-04 DIAGNOSIS — F17210 Nicotine dependence, cigarettes, uncomplicated: Secondary | ICD-10-CM | POA: Insufficient documentation

## 2020-02-04 DIAGNOSIS — R197 Diarrhea, unspecified: Secondary | ICD-10-CM | POA: Insufficient documentation

## 2020-02-04 DIAGNOSIS — R112 Nausea with vomiting, unspecified: Secondary | ICD-10-CM

## 2020-02-04 DIAGNOSIS — Z79899 Other long term (current) drug therapy: Secondary | ICD-10-CM | POA: Insufficient documentation

## 2020-02-04 LAB — CBC WITH DIFFERENTIAL/PLATELET
Abs Immature Granulocytes: 0.03 10*3/uL (ref 0.00–0.07)
Basophils Absolute: 0 10*3/uL (ref 0.0–0.1)
Basophils Relative: 0 %
Eosinophils Absolute: 0.1 10*3/uL (ref 0.0–0.5)
Eosinophils Relative: 1 %
HCT: 34.5 % — ABNORMAL LOW (ref 36.0–46.0)
Hemoglobin: 11.1 g/dL — ABNORMAL LOW (ref 12.0–15.0)
Immature Granulocytes: 1 %
Lymphocytes Relative: 10 %
Lymphs Abs: 0.6 10*3/uL — ABNORMAL LOW (ref 0.7–4.0)
MCH: 29.8 pg (ref 26.0–34.0)
MCHC: 32.2 g/dL (ref 30.0–36.0)
MCV: 92.5 fL (ref 80.0–100.0)
Monocytes Absolute: 0.3 10*3/uL (ref 0.1–1.0)
Monocytes Relative: 6 %
Neutro Abs: 4.9 10*3/uL (ref 1.7–7.7)
Neutrophils Relative %: 82 %
Platelets: 347 10*3/uL (ref 150–400)
RBC: 3.73 MIL/uL — ABNORMAL LOW (ref 3.87–5.11)
RDW: 14.3 % (ref 11.5–15.5)
WBC: 5.9 10*3/uL (ref 4.0–10.5)
nRBC: 0 % (ref 0.0–0.2)

## 2020-02-04 LAB — URINALYSIS, ROUTINE W REFLEX MICROSCOPIC
Bilirubin Urine: NEGATIVE
Glucose, UA: NEGATIVE mg/dL
Hgb urine dipstick: NEGATIVE
Ketones, ur: NEGATIVE mg/dL
Leukocytes,Ua: NEGATIVE
Nitrite: NEGATIVE
Protein, ur: NEGATIVE mg/dL
Specific Gravity, Urine: 1.02 (ref 1.005–1.030)
pH: 6 (ref 5.0–8.0)

## 2020-02-04 LAB — COMPREHENSIVE METABOLIC PANEL
ALT: 14 U/L (ref 0–44)
AST: 17 U/L (ref 15–41)
Albumin: 4.3 g/dL (ref 3.5–5.0)
Alkaline Phosphatase: 48 U/L (ref 38–126)
Anion gap: 10 (ref 5–15)
BUN: 14 mg/dL (ref 6–20)
CO2: 22 mmol/L (ref 22–32)
Calcium: 8.4 mg/dL — ABNORMAL LOW (ref 8.9–10.3)
Chloride: 106 mmol/L (ref 98–111)
Creatinine, Ser: 0.65 mg/dL (ref 0.44–1.00)
GFR calc Af Amer: >60 mL/min (ref >60)
GFR calc non Af Amer: >60 mL/min (ref >60)
Glucose, Bld: 101 mg/dL — ABNORMAL HIGH (ref 70–99)
Potassium: 4 mmol/L (ref 3.5–5.1)
Sodium: 138 mmol/L (ref 135–145)
Total Bilirubin: 0.5 mg/dL (ref 0.3–1.2)
Total Protein: 7.2 g/dL (ref 6.5–8.1)

## 2020-02-04 LAB — I-STAT BETA HCG BLOOD, ED (MC, WL, AP ONLY): I-stat hCG, quantitative: <5 m[IU]/mL (ref <5)

## 2020-02-04 LAB — SARS CORONAVIRUS 2 BY RT PCR (HOSPITAL ORDER, PERFORMED IN ~~LOC~~ HOSPITAL LAB): SARS Coronavirus 2: NEGATIVE

## 2020-02-04 LAB — ETHANOL: Alcohol, Ethyl (B): <10 mg/dL (ref <10)

## 2020-02-04 LAB — LIPASE, BLOOD: Lipase: 23 U/L (ref 11–51)

## 2020-02-04 MED ORDER — SODIUM CHLORIDE 0.9% FLUSH: 3.0000 mL | Freq: Once | INTRAVENOUS | Status: DC

## 2020-02-04 MED ORDER — SODIUM CHLORIDE 0.9 % IV BOLUS
1000.0000 mL | Freq: Once | INTRAVENOUS | Status: AC
  Administered 2020-02-04: 1000 mL via INTRAVENOUS

## 2020-02-04 NOTE — ED Notes (Signed)
Patient provided with crackers and ginger ale

## 2020-02-04 NOTE — ED Provider Notes (Signed)
[facility] DEPT Provider Note   CSN: [id number] Arrival date & time: 02/04/20  2993     History Chief Complaint  Patient presents with  . Nausea  . Emesis  . Diarrhea    Alysah V Kretchmer is a 39 y.o. female.  HPI    Patient presents with concern of nausea, vomiting, diarrhea. She has minimal abdominal pain, but with the past day has had a new, unusual for her illness. She was in her usual state of health prior to this, denies recent sick contacts, has not received Covid vaccine, is unaware of prior Covid infection.  She works in a call center. Without clear precipitant yesterday she developed profuse watery diarrhea, and subsequently nausea with vomiting. Episodes have been profuse, including defecating prior to going to the bathroom. She denies actual pain, does have some mild headache, occasional chills, but no fever. No medication taken for relief.  Past Medical History:  Diagnosis Date  . Anemia   . Hx of trichomoniasis 09/2016  . Kidney stones   . Preterm labor     Patient Active Problem List   Diagnosis Date Noted  . Marijuana abuse 12/25/2016  . Trichomonal vaginitis during pregnancy in second trimester 11/25/2016  . History of preterm delivery 11/25/2016    Past Surgical History:  Procedure Laterality Date  . CESAREAN SECTION N/A 02/11/2017   Procedure: CESAREAN SECTION;  Surgeon: Chancy Milroy, MD;  Location: [place];  Service: Obstetrics;  Laterality: N/A;  . WISDOM TOOTH EXTRACTION       OB History    Gravida  4   Para  2   Term      Preterm  2   AB  1   Living  2     SAB  1   TAB      Ectopic      Multiple  0   Live Births  2        Obstetric Comments  G1: PPROM and then stayed pregnant for about two weeks and then had delivery G2: patient states she had no PNC was 5 months and sounds like she had sudden sensation of having to push/low belly pressure. She came to the hospital  here and had SVD with NN  death.         Family History  Problem Relation Age of Onset  . Varicose Veins Mother   . Alcohol abuse Father   . Drug abuse Father     Social History   Tobacco Use  . Smoking status: Current Every Day Smoker    Packs/day: 0.25    Types: Cigarettes  . Smokeless tobacco: Never Used  Substance Use Topics  . Alcohol use: Yes    Alcohol/week: 1.0 standard drinks    Types: 1 Shots of liquor per week  . Drug use: No    Home Medications Prior to Admission medications   Medication Sig Start Date End Date Taking? Authorizing Provider  albuterol (PROVENTIL HFA;VENTOLIN HFA) 108 (90 BASE) MCG/ACT inhaler Inhale 1-2 puffs into the lungs every 6 (six) hours as needed for wheezing or shortness of breath.   Yes [provider]  ibuprofen (ADVIL) 200 MG tablet Take 200 mg by mouth every 6 (six) hours as needed for moderate pain.   Yes [provider]    Allergies    Patient has no known allergies.  Review of Systems   Review of Systems  Constitutional:  Per HPI, otherwise negative  HENT:       Per HPI, otherwise negative  Respiratory:       Per HPI, otherwise negative  Cardiovascular:       Per HPI, otherwise negative  Gastrointestinal: Positive for diarrhea, nausea and vomiting. Negative for abdominal pain.  Endocrine:       Negative aside from HPI  Genitourinary:       Neg aside from HPI   Musculoskeletal:       Per HPI, otherwise negative  Skin: Negative.   Neurological: Negative for syncope.    Physical Exam Updated Vital Signs BP 134/88   Pulse 82   Temp 98.2 F (36.8 C) (Oral)   Resp 16   SpO2 100%   Physical Exam Vitals and nursing note reviewed.  Constitutional:      General: She is not in acute distress.    Appearance: She is well-developed.  HENT:     Head: Normocephalic and atraumatic.  Eyes:     Conjunctiva/sclera: Conjunctivae normal.  Cardiovascular:     Rate and Rhythm: Normal rate and regular  rhythm.  Pulmonary:     Effort: Pulmonary effort is normal. No respiratory distress.     Breath sounds: Normal breath sounds. No stridor.  Abdominal:     General: There is no distension.     Tenderness: There is no abdominal tenderness. There is no guarding or rebound.  Skin:    General: Skin is warm and dry.  Neurological:     Mental Status: She is alert and oriented to person, place, and time.     Cranial Nerves: No cranial nerve deficit.     ED Results / Procedures / Treatments   Labs (all labs ordered are listed, but only abnormal results are displayed) Labs Reviewed  COMPREHENSIVE METABOLIC PANEL - Abnormal; Notable for the following components:      Result Value   Glucose, Bld 101 (*)    Calcium 8.4 (*)    All other components within normal limits  CBC WITH DIFFERENTIAL/PLATELET - Abnormal; Notable for the following components:   RBC 3.73 (*)    Hemoglobin 11.1 (*)    HCT 34.5 (*)    Lymphs Abs 0.6 (*)    All other components within normal limits  SARS CORONAVIRUS 2 BY RT PCR (HOSPITAL ORDER, PERFORMED IN [facility])  LIPASE, BLOOD  URINALYSIS, ROUTINE W REFLEX MICROSCOPIC  ETHANOL  I-STAT BETA HCG BLOOD, ED ([facility], [facility], [facility] ONLY)    Procedures Procedures (including critical care time)  Medications Ordered in ED Medications  sodium chloride flush (NS) 0.9 % injection 3 mL (has no administration in time range)  sodium chloride 0.9 % bolus 1,000 mL (0 mLs Intravenous Stopped 02/04/20 1340)  sodium chloride 0.9 % bolus 1,000 mL (0 mLs Intravenous Stopped 02/04/20 1340)    ED Course  I have reviewed the triage vital signs and the nursing notes.  Pertinent labs & imaging results that were available during my care of the patient were reviewed by me and considered in my medical decision making (see chart for details).  Update: Patient continues to have nausea, vomiting, no additional diarrhea  1:56 PM Symptoms resolved, patient has been tolerant of oral  intake.  No additional vomiting, no diarrhea.  We discussed findings, generally reassuring, with only mild abnormalities.  With no peritonitis, no ongoing pain, no fever, low suspicion for diverticulitis, colitis or other abdominal infection.  Some suspicion for food reaction given the  profound nature of her symptoms, improvement here with fluids, antiemetics.  Patient is hemodynamically unremarkable, requesting discharge, and this is appropriate given her resolution of symptoms. Final Clinical Impression(s) / ED Diagnoses Final diagnoses:  Nausea vomiting and diarrhea     Gerhard Munch, MD 02/04/20 1357

## 2020-02-04 NOTE — Discharge Instructions (Addendum)
As discussed, your evaluation today has been largely reassuring.  But, it is important that you monitor your condition carefully, and do not hesitate to return to the ED if you develop new, or concerning changes in your condition. ? ?Otherwise, please follow-up with your physician for appropriate ongoing care. ? ?

## 2020-02-04 NOTE — ED Notes (Signed)
Patient tolerating PO intake

## 2020-02-04 NOTE — ED Notes (Signed)
Patient made aware we need urine sample 

## 2020-02-04 NOTE — ED Triage Notes (Signed)
Patient here from home reporting repeated episodes of diarrhea. States that she had one episode of a uncontrolled bowel movement while laying in bed. Nausea vomiting that started at 1am.

## 2020-07-09 ENCOUNTER — Emergency Department (HOSPITAL_COMMUNITY)
Admission: EM | Admit: 2020-07-09 | Discharge: 2020-07-10 | Disposition: A | Discharge status: Home / Self Care | Payer: Medicaid Other | Attending: Emergency Medicine | Admitting: Emergency Medicine

## 2020-07-09 ENCOUNTER — Other Ambulatory Visit: Payer: Self-pay

## 2020-07-09 ENCOUNTER — Encounter (HOSPITAL_COMMUNITY): Payer: Self-pay | Admitting: Emergency Medicine

## 2020-07-09 DIAGNOSIS — K047 Periapical abscess without sinus: Secondary | ICD-10-CM

## 2020-07-09 DIAGNOSIS — F17210 Nicotine dependence, cigarettes, uncomplicated: Secondary | ICD-10-CM | POA: Insufficient documentation

## 2020-07-09 DIAGNOSIS — K0889 Other specified disorders of teeth and supporting structures: Secondary | ICD-10-CM | POA: Insufficient documentation

## 2020-07-09 NOTE — ED Triage Notes (Signed)
Pt c/o left side dental pain since this morning, pt states it feels like an abscess.

## 2020-07-10 MED ORDER — PENICILLIN V POTASSIUM 250 MG PO TABS
500.0000 mg | ORAL_TABLET | Freq: Once | ORAL | Status: AC
  Administered 2020-07-10: 500 mg via ORAL
  Filled 2020-07-10: qty 2

## 2020-07-10 MED ORDER — KETOROLAC TROMETHAMINE 30 MG/ML IJ SOLN
30.0000 mg | Freq: Once | INTRAMUSCULAR | Status: AC
  Administered 2020-07-10: 30 mg via INTRAMUSCULAR
  Filled 2020-07-10: qty 1

## 2020-07-10 MED ORDER — PENICILLIN V POTASSIUM 500 MG PO TABS: 500.0000 mg | ORAL_TABLET | Freq: Four times a day (QID) | ORAL | 0 refills | Status: AC

## 2020-07-10 NOTE — ED Notes (Signed)
Patient verbalizes understanding of discharge instructions. Opportunity for questioning and answers were provided. Armband removed by staff, pt discharged from ED ambulatory to home.  

## 2020-07-10 NOTE — ED Provider Notes (Signed)
[facility] EMERGENCY DEPARTMENT Provider Note   CSN: [id number] Arrival date & time: 07/09/20  2233     History   Chief Complaint Chief Complaint  Patient presents with  . Dental Pain    HPI Destiny Schneider is a 39 y.o. female.  Patient presents to the emergency department with a dental complaint. Symptoms began yesterday. The patient has tried to alleviate pain with Tylenol and ibuprofen.  Pain rated as severe, characterized as throbbing in nature and located left lower rear molar. Patient denies fever, night sweats, chills, difficulty swallowing or opening mouth, SOB, nuchal rigidity or decreased ROM of neck.  Patient does not have a dentist and requests a resource guide at discharge.      HPI  Past Medical History:  Diagnosis Date  . Anemia   . Hx of trichomoniasis 09/2016  . Kidney stones   . Preterm labor     Patient Active Problem List   Diagnosis Date Noted  . Marijuana abuse 12/25/2016  . Trichomonal vaginitis during pregnancy in second trimester 11/25/2016  . History of preterm delivery 11/25/2016    Past Surgical History:  Procedure Laterality Date  . CESAREAN SECTION N/A 02/11/2017   Procedure: CESAREAN SECTION;  Surgeon: Hermina Staggers, MD;  Location: [facility]TH[facility] SU[facility];  Service: Obstetrics;  Laterality: N/A;  . WISDOM TOOTH EXTRACTION       OB History    Gravida  4   Para  2   Term      Preterm  2   AB  1   Living  2     SAB  1   TAB      Ectopic      Multiple  0   Live Births  2        Obstetric Comments  G1: PPROM and then stayed pregnant for about two weeks and then had delivery G2: patient states she had no PNC was 5 months and sounds like she had sudden sensation of having to push/low belly pressure. She came to the hospital here and had SVD with NN  death.          Home Medications    Prior to Admission medications   Medication Sig Start Date End Date Taking? Authorizing Provider    albuterol (PROVENTIL HFA;VENTOLIN HFA) 108 (90 BASE) MCG/ACT inhaler Inhale 1-2 puffs into the lungs every 6 (six) hours as needed for wheezing or shortness of breath.    [provider]  ibuprofen (ADVIL) 200 MG tablet Take 200 mg by mouth every 6 (six) hours as needed for moderate pain.    [provider]  penicillin v potassium (VEETID) 500 MG tablet Take 1 tablet (500 mg total) by mouth 4 (four) times daily. 07/10/20   Roxy Horseman, PA-C    Family History Family History  Problem Relation Age of Onset  . Varicose Veins Mother   . Alcohol abuse Father   . Drug abuse Father     Social History Social History   Tobacco Use  . Smoking status: Current Every Day Smoker    Packs/day: 0.25    Types: Cigarettes  . Smokeless tobacco: Never Used  Vaping Use  . Vaping Use: Never used  Substance Use Topics  . Alcohol use: Yes    Alcohol/week: 1.0 standard drink    Types: 1 Shots of liquor per week  . Drug use: No     Allergies   Patient has no known allergies.  Review of Systems Review of Systems  Constitutional: Negative for chills and fever.  HENT: Positive for dental problem. Negative for drooling.   Neurological: Negative for speech difficulty.  Psychiatric/Behavioral: Positive for sleep disturbance.     Physical Exam Updated Vital Signs BP 118/76 (BP Location: Right Arm)   Pulse 72   Temp 98.1 F (36.7 C) (Oral)   Resp 16   Ht 5' 6.5" (1.689 m)   Wt 56.2 kg   SpO2 100%   BMI 19.70 kg/m   Physical Exam Physical Exam  Constitutional: Pt appears well-developed and well-nourished.  HENT:  Head: Normocephalic.  Right Ear: Tympanic membrane, external ear and ear canal normal.  Left Ear: Tympanic membrane, external ear and ear canal normal.  Nose: Nose normal. Right sinus exhibits no maxillary sinus tenderness and no frontal sinus tenderness. Left sinus exhibits no maxillary sinus tenderness and no frontal sinus tenderness.  Mouth/Throat:  Uvula is midline, oropharynx is clear and moist and mucous membranes are normal. No oral lesions. No uvula swelling or lacerations. No oropharyngeal exudate, posterior oropharyngeal edema, posterior oropharyngeal erythema or tonsillar abscesses.  Poor dentition No gingival swelling, fluctuance or induration No gross abscess  No sublingual edema, tenderness to palpation, or sign of Ludwig's angina, or deep space infection Pain at tooth #18 Eyes: Conjunctivae are normal. Pupils are equal, round, and reactive to light. Right eye exhibits no discharge. Left eye exhibits no discharge.  Neck: Normal range of motion. Neck supple.  No stridor Handling secretions without difficulty No nuchal rigidity No cervical lymphadenopathy Cardiovascular: Normal rate, regular rhythm and normal heart sounds.   Pulmonary/Chest: Effort normal. No respiratory distress.  Equal chest rise  Abdominal: Soft. Bowel sounds are normal. Pt exhibits no distension. There is no tenderness.  Lymphadenopathy: Pt has no cervical adenopathy.  Neurological: Pt is alert and oriented x 4  Skin: Skin is warm and dry.  Psychiatric: Pt has a normal mood and affect.  Nursing note and vitals reviewed.   ED Treatments / Results  Labs (all labs ordered are listed, but only abnormal results are displayed) Labs Reviewed - No data to display  EKG    Radiology No results found.  Procedures Procedures (including critical care time)  Medications Ordered in ED Medications  ketorolac (TORADOL) 30 MG/ML injection 30 mg (has no administration in time range)  penicillin v potassium (VEETID) tablet 500 mg (has no administration in time range)     Initial Impression / Assessment and Plan / ED Course  I have reviewed the triage vital signs and the nursing notes.  Pertinent labs & imaging results that were available during my care of the patient were reviewed by me and considered in my medical decision making (see chart for  details).        Patient with dentalgia.  No abscess requiring immediate incision and drainage.  Exam not concerning for Ludwig's angina or pharyngeal abscess.  Will treat with penicillin. Pt instructed to follow-up with dentist.  Discussed return precautions. Pt safe for discharge.   Final Clinical Impressions(s) / ED Diagnoses   Final diagnoses:  Pain, dental  Dental abscess    ED Discharge Orders         Ordered    penicillin v potassium (VEETID) 500 MG tablet  4 times daily        07/10/20 0040           Roxy Horseman, PA-C 07/10/20 0043    Dione Booze, MD 07/10/20 0221

## 2020-08-23 ENCOUNTER — Emergency Department (HOSPITAL_COMMUNITY): Payer: 59

## 2020-08-23 ENCOUNTER — Emergency Department (HOSPITAL_COMMUNITY)
Admission: EM | Admit: 2020-08-23 | Discharge: 2020-08-23 | Disposition: A | Discharge status: Home / Self Care | Payer: 59 | Attending: Emergency Medicine | Admitting: Emergency Medicine

## 2020-08-23 ENCOUNTER — Encounter (HOSPITAL_COMMUNITY): Payer: Self-pay | Admitting: Emergency Medicine

## 2020-08-23 ENCOUNTER — Other Ambulatory Visit: Payer: Self-pay

## 2020-08-23 DIAGNOSIS — R059 Cough, unspecified: Secondary | ICD-10-CM | POA: Diagnosis present

## 2020-08-23 DIAGNOSIS — F17210 Nicotine dependence, cigarettes, uncomplicated: Secondary | ICD-10-CM | POA: Insufficient documentation

## 2020-08-23 DIAGNOSIS — Z20822 Contact with and (suspected) exposure to covid-19: Secondary | ICD-10-CM | POA: Diagnosis not present

## 2020-08-23 DIAGNOSIS — J101 Influenza due to other identified influenza virus with other respiratory manifestations: Secondary | ICD-10-CM | POA: Insufficient documentation

## 2020-08-23 LAB — COMPREHENSIVE METABOLIC PANEL
ALT: 70 U/L — ABNORMAL HIGH (ref 0–44)
AST: 50 U/L — ABNORMAL HIGH (ref 15–41)
Albumin: 4.4 g/dL (ref 3.5–5.0)
Alkaline Phosphatase: 54 U/L (ref 38–126)
Anion gap: 10 (ref 5–15)
BUN: 14 mg/dL (ref 6–20)
CO2: 21 mmol/L — ABNORMAL LOW (ref 22–32)
Calcium: 8.6 mg/dL — ABNORMAL LOW (ref 8.9–10.3)
Chloride: 106 mmol/L (ref 98–111)
Creatinine, Ser: 0.63 mg/dL (ref 0.44–1.00)
GFR, Estimated: >60 mL/min (ref >60)
Glucose, Bld: 105 mg/dL — ABNORMAL HIGH (ref 70–99)
Potassium: 3.7 mmol/L (ref 3.5–5.1)
Sodium: 137 mmol/L (ref 135–145)
Total Bilirubin: 0.5 mg/dL (ref 0.3–1.2)
Total Protein: 7.6 g/dL (ref 6.5–8.1)

## 2020-08-23 LAB — CBC
HCT: 32.9 % — ABNORMAL LOW (ref 36.0–46.0)
Hemoglobin: 10.8 g/dL — ABNORMAL LOW (ref 12.0–15.0)
MCH: 29.6 pg (ref 26.0–34.0)
MCHC: 32.8 g/dL (ref 30.0–36.0)
MCV: 90.1 fL (ref 80.0–100.0)
Platelets: 348 10*3/uL (ref 150–400)
RBC: 3.65 MIL/uL — ABNORMAL LOW (ref 3.87–5.11)
RDW: 15 % (ref 11.5–15.5)
WBC: 5 10*3/uL (ref 4.0–10.5)
nRBC: 0 % (ref 0.0–0.2)

## 2020-08-23 LAB — URINALYSIS, ROUTINE W REFLEX MICROSCOPIC
Glucose, UA: NEGATIVE mg/dL
Hgb urine dipstick: NEGATIVE
Ketones, ur: 20 mg/dL — AB
Leukocytes,Ua: NEGATIVE
Nitrite: NEGATIVE
Protein, ur: 30 mg/dL — AB
Specific Gravity, Urine: 1.039 — ABNORMAL HIGH (ref 1.005–1.030)
pH: 5 (ref 5.0–8.0)

## 2020-08-23 LAB — RESP PANEL BY RT-PCR (FLU A&B, COVID) ARPGX2
Influenza A by PCR: POSITIVE — AB
Influenza B by PCR: NEGATIVE
SARS Coronavirus 2 by RT PCR: NEGATIVE

## 2020-08-23 LAB — I-STAT BETA HCG BLOOD, ED (MC, WL, AP ONLY): I-stat hCG, quantitative: <5 m[IU]/mL (ref <5)

## 2020-08-23 LAB — LIPASE, BLOOD: Lipase: 20 U/L (ref 11–51)

## 2020-08-23 MED ORDER — ACETAMINOPHEN 325 MG PO TABS
650.0000 mg | ORAL_TABLET | Freq: Once | ORAL | Status: AC
  Administered 2020-08-23: 15:00:00 650 mg via ORAL
  Filled 2020-08-23: qty 2

## 2020-08-23 MED ORDER — ONDANSETRON 4 MG PO TBDP: 4.0000 mg | ORAL_TABLET | Freq: Three times a day (TID) | ORAL | 0 refills | Status: AC | PRN

## 2020-08-23 MED ORDER — SODIUM CHLORIDE 0.9 % IV BOLUS
1000.0000 mL | Freq: Once | INTRAVENOUS | Status: AC
  Administered 2020-08-23: 16:00:00 1000 mL via INTRAVENOUS

## 2020-08-23 MED ORDER — SODIUM CHLORIDE 0.9 % IV BOLUS
1000.0000 mL | Freq: Once | INTRAVENOUS | Status: AC
  Administered 2020-08-23: 15:00:00 1000 mL via INTRAVENOUS

## 2020-08-23 MED ORDER — ONDANSETRON 4 MG PO TBDP
4.0000 mg | ORAL_TABLET | Freq: Once | ORAL | Status: AC
  Administered 2020-08-23: 15:00:00 4 mg via ORAL
  Filled 2020-08-23: qty 1

## 2020-08-23 NOTE — ED Notes (Signed)
Pt refused recheck vitals °

## 2020-08-23 NOTE — ED Triage Notes (Signed)
Pt c/o cough with green phlegm and hurts to cough as well as vomiting, congestion for several days. Reports did a home covid test 12 days ago that was negative.

## 2020-08-23 NOTE — ED Provider Notes (Signed)
[facility]-EMERGENCY DEPT Provider Note   CSN: [id number] Arrival date & time: 08/23/20  0847     History Chief Complaint  Patient presents with   Cough   Nasal Congestion   Emesis    Destiny Schneider is a 39 y.o. female.  HPI 39 year old female with a history of anemia, trichomoniasis, marijuana abuse presents to the ER with 2 days of body aches, nausea, vomiting, cough with green phlegm.  States that she took at home Covid test 2 years ago and this was negative.  Has had normal bowel movements.  Denies any abdominal pain, diarrhea, no loss of taste or smell.  Has had poor p.o. intake over the last 2 days.  No other sick contacts.  She is not vaccinated for Covid.  Denies any chest pain or shortness of breath.  Endorses subjective fevers but no measurable fevers.  Denies any lower abdominal pain, no vaginal discharge or bleeding.  Past Medical History:  Diagnosis Date   Anemia    Hx of trichomoniasis 09/2016   Kidney stones    Preterm labor     Patient Active Problem List   Diagnosis Date Noted   Marijuana abuse 12/25/2016   Trichomonal vaginitis during pregnancy in second trimester 11/25/2016   History of preterm delivery 11/25/2016    Past Surgical History:  Procedure Laterality Date   CESAREAN SECTION N/A 02/11/2017   Procedure: CESAREAN SECTION;  Surgeon: Hermina Staggers, MD;  Location: [facility]THING SUIT[facility];  Service: Obstetrics;  Laterality: N/A;   WISDOM TOOTH EXTRACTION       OB History    Gravida  4   Para  2   Term      Preterm  2   AB  1   Living  2     SAB  1   IAB      Ectopic      Multiple  0   Live Births  2        Obstetric Comments  G1: PPROM and then stayed pregnant for about two weeks and then had delivery G2: patient states she had no PNC was 5 months and sounds like she had sudden sensation of having to push/low belly pressure. She came to the hospital here and had SVD with NN  death.          Family History  Problem Relation Age of Onset   Varicose Veins Mother    Alcohol abuse Father    Drug abuse Father     Social History   Tobacco Use   Smoking status: Current Every Day Smoker    Packs/day: 0.25    Types: Cigarettes   Smokeless tobacco: Never Used  Vaping Use   Vaping Use: Never used  Substance Use Topics   Alcohol use: Yes    Alcohol/week: 1.0 standard drink    Types: 1 Shots of liquor per week   Drug use: No    Home Medications Prior to Admission medications   Medication Sig Start Date End Date Taking? Authorizing Provider  albuterol (PROVENTIL HFA;VENTOLIN HFA) 108 (90 BASE) MCG/ACT inhaler Inhale 1-2 puffs into the lungs every 6 (six) hours as needed for wheezing or shortness of breath.    [provider]  ibuprofen (ADVIL) 200 MG tablet Take 200 mg by mouth every 6 (six) hours as needed for moderate pain.    [provider]  ondansetron (ZOFRAN ODT) 4 MG disintegrating tablet Take 1 tablet (4 mg total) by  mouth every 8 (eight) hours as needed for nausea or vomiting. 08/23/20   Trudee Grip A, PA-C  penicillin v potassium (VEETID) 500 MG tablet Take 1 tablet (500 mg total) by mouth 4 (four) times daily. 07/10/20   Roxy Horseman, PA-C    Allergies    Patient has no known allergies.  Review of Systems   Review of Systems  Constitutional: Positive for activity change, appetite change, chills, fatigue and fever.  Respiratory: Positive for cough. Negative for shortness of breath.   Cardiovascular: Negative for chest pain.  Gastrointestinal: Positive for nausea and vomiting. Negative for abdominal pain and diarrhea.  Genitourinary: Negative for dysuria.  Neurological: Positive for weakness.    Physical Exam Updated Vital Signs BP 108/77    Pulse 84    Temp 98.9 F (37.2 C) (Oral)    Resp 15    LMP 08/22/2020    SpO2 98%   Physical Exam Vitals and nursing note reviewed.  Constitutional:      General: She is not in  acute distress.    Appearance: She is well-developed and well-nourished.  HENT:     Head: Normocephalic and atraumatic.  Eyes:     Conjunctiva/sclera: Conjunctivae normal.  Cardiovascular:     Rate and Rhythm: Normal rate and regular rhythm.     Heart sounds: No murmur heard.   Pulmonary:     Effort: Pulmonary effort is normal. No respiratory distress.     Breath sounds: Normal breath sounds.  Abdominal:     General: Abdomen is flat.     Palpations: Abdomen is soft.     Tenderness: There is no abdominal tenderness. There is no right CVA tenderness or left CVA tenderness.  Musculoskeletal:        General: No edema. Normal range of motion.     Cervical back: Neck supple.     Right lower leg: No edema.     Left lower leg: No edema.  Skin:    General: Skin is warm and dry.     Findings: No erythema or rash.  Neurological:     General: No focal deficit present.     Mental Status: She is alert and oriented to person, place, and time.  Psychiatric:        Mood and Affect: Mood and affect normal.     ED Results / Procedures / Treatments   Labs (all labs ordered are listed, but only abnormal results are displayed) Labs Reviewed  RESP PANEL BY RT-PCR (FLU A&B, COVID) ARPGX2 - Abnormal; Notable for the following components:      Result Value   Influenza A by PCR POSITIVE (*)    All other components within normal limits  COMPREHENSIVE METABOLIC PANEL - Abnormal; Notable for the following components:   CO2 21 (*)    Glucose, Bld 105 (*)    Calcium 8.6 (*)    AST 50 (*)    ALT 70 (*)    All other components within normal limits  CBC - Abnormal; Notable for the following components:   RBC 3.65 (*)    Hemoglobin 10.8 (*)    HCT 32.9 (*)    All other components within normal limits  URINALYSIS, ROUTINE W REFLEX MICROSCOPIC - Abnormal; Notable for the following components:   Color, Urine AMBER (*)    APPearance CLOUDY (*)    Specific Gravity, Urine 1.039 (*)    Bilirubin Urine  SMALL (*)    Ketones, ur 20 (*)  Protein, ur 30 (*)    Bacteria, UA RARE (*)    All other components within normal limits  LIPASE, BLOOD  I-STAT BETA HCG BLOOD, ED ([facility], [facility], [facility] ONLY)    EKG None  Radiology DG Chest Portable 1 View  Result Date: 08/23/2020 CLINICAL DATA:  Productive cough EXAM: PORTABLE CHEST 1 VIEW COMPARISON:  05/26/2012 FINDINGS: The heart size and mediastinal contours are within normal limits. Minimal interstitial prominence within the left lung base. Lungs are otherwise clear. No pleural effusion or pneumothorax. The visualized skeletal structures are unremarkable. IMPRESSION: Minimal interstitial prominence within the left lung base, which could reflect atelectasis or developing infiltrate. Electronically Signed   By: Duanne Guess D.O.   On: 08/23/2020 13:12    Procedures Procedures (including critical care time)  Medications Ordered in ED Medications  sodium chloride 0.9 % bolus 1,000 mL (0 mLs Intravenous Stopped 08/23/20 1620)  acetaminophen (TYLENOL) tablet 650 mg (650 mg Oral Given 08/23/20 1515)  sodium chloride 0.9 % bolus 1,000 mL (1,000 mLs Intravenous New Bag/Given 08/23/20 1620)  ondansetron (ZOFRAN-ODT) disintegrating tablet 4 mg (4 mg Oral Given 08/23/20 1516)    ED Course  I have reviewed the triage vital signs and the nursing notes.  Pertinent labs & imaging results that were available during my care of the patient were reviewed by me and considered in my medical decision making (see chart for details).  Clinical Course as of 08/23/20 1644  Sun Aug 23, 2020  1451 Squamous Epithelial / LPF: 21-50 [MB]    Clinical Course User Index [MB] Leone Brand   MDM Rules/Calculators/A&P                          39 year old female with weakness, body aches, nausea, vomiting, cough.  On arrival, she is alert, oriented, nontoxic-appearing, no acute distress, is resting comfortably in the ER bed.  Speaking full sentences without  increased work of breathing.  Vitals on arrival overall reassuring, afebrile, not hypoxic tachypneic or tachycardic.  Physical exam with clear lung sounds.  Soft and nontender abdomen.  No flank tenderness.  DDx includes COVID-19, flu, viral gastroenteritis, appendicitis, UTI/Pyelo  Labs personally reviewed and interpreted by me -CBC without leukocytosis, hemoglobin of 10.8 which appears to be stable. -CMP without any significant electrode abnormalities, mild transaminitis noted. -UA with ketones, protein, rare bacteria, elevated specific gravity and cloudy urine.  Likely consistent with dehydration. -Negative pregnancy -Negative lipase -Negative Covid test, however positive for flu A.  Imaging reviewed and interpreted by myself and radiology -Chest x-ray with minimal interstitial prominence in the left lung base likely atelectasis or developing infiltrate  MDM: Overall work-up reassuring.  Symptoms likely secondary to the flu.  Patient received 2 L of fluids as she did appear dehydrated based on her urine.  Ambulated with no evidence of hypoxia.  Patient was instructed for supportive measures, quarantine.  Precautions discussed.  She voiced understanding and is agreeable. Final Clinical Impression(s) / ED Diagnoses Final diagnoses:  Influenza A    Rx / DC Orders ED Discharge Orders         Ordered    ondansetron (ZOFRAN ODT) 4 MG disintegrating tablet  Every 8 hours PRN        08/23/20 1543           Mare Ferrari, PA-C 08/23/20 1644    Glynn Octave, MD 08/23/20 2057

## 2020-08-23 NOTE — ED Notes (Signed)
I called patient back to collect labs patient started fusing at me about they should got her blood three hours ago. That's why she does not like [place] at this hospital because if they would have got them three hours ago results would be back.  She also upset because she wanted to know when she would get a room.  She was cursing and very upset so I ask the nurse to step in and talk to her and got someone else to collect her labs.

## 2020-08-23 NOTE — Discharge Instructions (Addendum)
You tested positive for the flu today. You may be developing an early viral pneumonia, however we do not treat this with antibiotics.    TREATMENT  Treatment is directed at relieving symptoms. There is no cure. Antibiotics are not effective, because the infection is caused by a virus, not by bacteria. Treatment may include:  Increased fluid intake. Sports drinks offer valuable electrolytes, sugars, and fluids.  Breathing heated mist or steam (vaporizer or shower).  Eating chicken soup or other clear broths, and maintaining good nutrition.  Getting plenty of rest.  Using gargles or lozenges for comfort.  Increasing usage of your inhaler if you have asthma.  Return to work when your temperature has returned to normal.  Gargle warm salt water and spit it out for sore throat. Take benadryl to decrease sinus secretions. Continue to alternate between Tylenol and ibuprofen for pain and fever control.  Follow Up: Follow up with your primary care doctor in 5-7 days for recheck of ongoing symptoms.  Please make sure to quarantine from other people as the Flu is also contagious. Return to emergency department for emergent changing or worsening of symptoms.

## 2020-08-23 NOTE — ED Notes (Signed)
Patient ambulated next to the bed for approximately 90 seconds.  Her O2 saturation level started at 100% and remained at 100% for approximately 70 seconds, then it dropped to 97% for the remaining 20 seconds of ambulation.

## 2023-02-15 ENCOUNTER — Other Ambulatory Visit: Payer: Self-pay

## 2023-02-15 ENCOUNTER — Emergency Department (HOSPITAL_COMMUNITY)
Admission: EM | Admit: 2023-02-15 | Discharge: 2023-02-15 | Disposition: A | Discharge status: Home / Self Care | Payer: Medicaid Other | Attending: Emergency Medicine | Admitting: Emergency Medicine

## 2023-02-15 ENCOUNTER — Emergency Department (HOSPITAL_COMMUNITY): Payer: Medicaid Other

## 2023-02-15 DIAGNOSIS — S199XXA Unspecified injury of neck, initial encounter: Secondary | ICD-10-CM | POA: Diagnosis present

## 2023-02-15 DIAGNOSIS — M545 Low back pain, unspecified: Secondary | ICD-10-CM | POA: Diagnosis not present

## 2023-02-15 DIAGNOSIS — Y92410 Unspecified street and highway as the place of occurrence of the external cause: Secondary | ICD-10-CM | POA: Diagnosis not present

## 2023-02-15 DIAGNOSIS — S161XXA Strain of muscle, fascia and tendon at neck level, initial encounter: Secondary | ICD-10-CM | POA: Diagnosis not present

## 2023-02-15 MED ORDER — IBUPROFEN 800 MG PO TABS: 800.0000 mg | ORAL_TABLET | Freq: Three times a day (TID) | ORAL | 1 refills | Status: AC | PRN

## 2023-02-15 MED ORDER — CYCLOBENZAPRINE HCL 10 MG PO TABS: 10.0000 mg | ORAL_TABLET | Freq: Three times a day (TID) | ORAL | 0 refills | Status: AC | PRN
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
## 2023-02-15 NOTE — Discharge Instructions (Signed)
Follow-up with your family doctor next week if not improving.  Return if problems.  Just rest for the next few days

## 2023-11-21 ENCOUNTER — Emergency Department (HOSPITAL_COMMUNITY)

## 2023-11-21 ENCOUNTER — Other Ambulatory Visit: Payer: Self-pay

## 2023-11-21 ENCOUNTER — Encounter (HOSPITAL_COMMUNITY): Payer: Self-pay

## 2023-11-21 ENCOUNTER — Emergency Department (HOSPITAL_COMMUNITY)
Admission: EM | Admit: 2023-11-21 | Discharge: 2023-11-21 | Disposition: A | Discharge status: Home / Self Care | Attending: Emergency Medicine | Admitting: Emergency Medicine

## 2023-11-21 DIAGNOSIS — K219 Gastro-esophageal reflux disease without esophagitis: Secondary | ICD-10-CM | POA: Diagnosis present

## 2023-11-21 DIAGNOSIS — D649 Anemia, unspecified: Secondary | ICD-10-CM | POA: Insufficient documentation

## 2023-11-21 DIAGNOSIS — Z79899 Other long term (current) drug therapy: Secondary | ICD-10-CM | POA: Diagnosis not present

## 2023-11-21 LAB — COMPREHENSIVE METABOLIC PANEL
ALT: 9 U/L (ref 0–44)
AST: 14 U/L — ABNORMAL LOW (ref 15–41)
Albumin: 4.3 g/dL (ref 3.5–5.0)
Alkaline Phosphatase: 57 U/L (ref 38–126)
Anion gap: 10 (ref 5–15)
BUN: 14 mg/dL (ref 6–20)
CO2: 20 mmol/L — ABNORMAL LOW (ref 22–32)
Calcium: 9 mg/dL (ref 8.9–10.3)
Chloride: 104 mmol/L (ref 98–111)
Creatinine, Ser: 0.7 mg/dL (ref 0.44–1.00)
GFR, Estimated: >60 mL/min (ref >60)
Glucose, Bld: 93 mg/dL (ref 70–99)
Potassium: 3.7 mmol/L (ref 3.5–5.1)
Sodium: 134 mmol/L — ABNORMAL LOW (ref 135–145)
Total Bilirubin: 0.5 mg/dL (ref 0.0–1.2)
Total Protein: 7.6 g/dL (ref 6.5–8.1)

## 2023-11-21 LAB — CBC WITH DIFFERENTIAL/PLATELET
Abs Immature Granulocytes: 0.03 10*3/uL (ref 0.00–0.07)
Basophils Absolute: 0 10*3/uL (ref 0.0–0.1)
Basophils Relative: 1 %
Eosinophils Absolute: 0.2 10*3/uL (ref 0.0–0.5)
Eosinophils Relative: 5 %
HCT: 32.4 % — ABNORMAL LOW (ref 36.0–46.0)
Hemoglobin: 9.7 g/dL — ABNORMAL LOW (ref 12.0–15.0)
Immature Granulocytes: 1 %
Lymphocytes Relative: 29 %
Lymphs Abs: 1.3 10*3/uL (ref 0.7–4.0)
MCH: 25.3 pg — ABNORMAL LOW (ref 26.0–34.0)
MCHC: 29.9 g/dL — ABNORMAL LOW (ref 30.0–36.0)
MCV: 84.6 fL (ref 80.0–100.0)
Monocytes Absolute: 0.4 10*3/uL (ref 0.1–1.0)
Monocytes Relative: 9 %
Neutro Abs: 2.6 10*3/uL (ref 1.7–7.7)
Neutrophils Relative %: 55 %
Platelets: 520 10*3/uL — ABNORMAL HIGH (ref 150–400)
RBC: 3.83 MIL/uL — ABNORMAL LOW (ref 3.87–5.11)
RDW: 17.1 % — ABNORMAL HIGH (ref 11.5–15.5)
WBC: 4.7 10*3/uL (ref 4.0–10.5)
nRBC: 0 % (ref 0.0–0.2)

## 2023-11-21 LAB — VITAMIN B12: Vitamin B-12: 125 pg/mL — ABNORMAL LOW (ref 180–914)

## 2023-11-21 LAB — IRON AND TIBC
Iron: 47 ug/dL (ref 28–170)
Saturation Ratios: 9 % — ABNORMAL LOW (ref 10.4–31.8)
TIBC: 542 ug/dL — ABNORMAL HIGH (ref 250–450)
UIBC: 495 ug/dL

## 2023-11-21 LAB — RETICULOCYTES
Immature Retic Fract: 26.7 % — ABNORMAL HIGH (ref 2.3–15.9)
RBC.: 3.73 MIL/uL — ABNORMAL LOW (ref 3.87–5.11)
Retic Count, Absolute: 51.5 10*3/uL (ref 19.0–186.0)
Retic Ct Pct: 1.4 % (ref 0.4–3.1)

## 2023-11-21 LAB — LIPASE, BLOOD: Lipase: 28 U/L (ref 11–51)

## 2023-11-21 LAB — RESP PANEL BY RT-PCR (RSV, FLU A&B, COVID)  RVPGX2
Influenza A by PCR: NEGATIVE
Influenza B by PCR: NEGATIVE
Resp Syncytial Virus by PCR: NEGATIVE
SARS Coronavirus 2 by RT PCR: NEGATIVE

## 2023-11-21 LAB — FOLATE: Folate: 10.5 ng/mL (ref >5.9)

## 2023-11-21 LAB — HCG, SERUM, QUALITATIVE: Preg, Serum: NEGATIVE

## 2023-11-21 LAB — FERRITIN: Ferritin: 3 ng/mL — ABNORMAL LOW (ref 11–307)

## 2023-11-21 MED ORDER — FAMOTIDINE IN NACL 20-0.9 MG/50ML-% IV SOLN
20.0000 mg | Freq: Once | INTRAVENOUS | Status: AC
  Administered 2023-11-21: 20 mg via INTRAVENOUS
  Filled 2023-11-21: qty 50

## 2023-11-21 MED ORDER — OMEPRAZOLE 40 MG PO CPDR: 40.0000 mg | DELAYED_RELEASE_CAPSULE | Freq: Every day | ORAL | 0 refills | Status: AC

## 2023-11-21 MED ORDER — FERROUS SULFATE 325 (65 FE) MG PO TABS: 325.0000 mg | ORAL_TABLET | Freq: Every day | ORAL | 0 refills | Status: AC

## 2023-11-21 NOTE — ED Provider Notes (Signed)
 [place] [facility]GENCY DEPARTMENT AT [facility] Provider Note   CSN: [id number] Arrival date & time: 11/21/23  0740     History  Chief Complaint  Patient presents with   Gastroesophageal Reflux    Destiny Schneider is a 43 y.o. female.  Pt is a 43 yo female with pmhx significant for tobacco abuse.  She said she's had pain when she swallows for the past week.  She did try Mylanta and Omeprazole, but sx are not going away.  Pt wanted to be checked to make sure it's nothing other than GERD.         Home Medications Prior to Admission medications   Medication Sig Start Date End Date Taking? Authorizing Provider  ferrous sulfate 325 (65 FE) MG tablet Take 1 tablet (325 mg total) by mouth daily. 11/21/23  Yes Jacalyn Lefevre, MD  omeprazole (PRILOSEC) 40 MG capsule Take 1 capsule (40 mg total) by mouth daily. 11/21/23  Yes Jacalyn Lefevre, MD  albuterol (PROVENTIL HFA;VENTOLIN HFA) 108 (90 BASE) MCG/ACT inhaler Inhale 1-2 puffs into the lungs every 6 (six) hours as needed for wheezing or shortness of breath.    [provider]  cyclobenzaprine (FLEXERIL) 10 MG tablet Take 1 tablet (10 mg total) by mouth 3 (three) times daily as needed for muscle spasms. 02/15/23   Bethann Berkshire, MD  ibuprofen (ADVIL) 800 MG tablet Take 1 tablet (800 mg total) by mouth every 8 (eight) hours as needed. 02/15/23   Bethann Berkshire, MD  ondansetron (ZOFRAN ODT) 4 MG disintegrating tablet Take 1 tablet (4 mg total) by mouth every 8 (eight) hours as needed for nausea or vomiting. 08/23/20   Trudee Grip A, PA-C  penicillin v potassium (VEETID) 500 MG tablet Take 1 tablet (500 mg total) by mouth 4 (four) times daily. 07/10/20   Roxy Horseman, PA-C      Allergies    Patient has no known allergies.    Review of Systems   Review of Systems  Gastrointestinal:        Painful swallowing  All other systems reviewed and are negative.   Physical Exam Updated Vital Signs BP 110/75   Pulse  92   Temp 98.1 F (36.7 C) (Oral)   Resp 18   Ht 5' 6.5" (1.689 m)   Wt 56.2 kg   SpO2 100%   BMI 19.70 kg/m  Physical Exam Vitals and nursing note reviewed.  Constitutional:      Appearance: Normal appearance.  HENT:     Head: Normocephalic and atraumatic.     Right Ear: External ear normal.     Left Ear: External ear normal.     Nose: Nose normal.     Mouth/Throat:     Mouth: Mucous membranes are moist.     Pharynx: Oropharynx is clear.  Eyes:     Extraocular Movements: Extraocular movements intact.     Conjunctiva/sclera: Conjunctivae normal.     Pupils: Pupils are equal, round, and reactive to light.  Cardiovascular:     Rate and Rhythm: Normal rate and regular rhythm.     Pulses: Normal pulses.     Heart sounds: Normal heart sounds.  Pulmonary:     Effort: Pulmonary effort is normal.     Breath sounds: Normal breath sounds.  Abdominal:     General: Abdomen is flat. Bowel sounds are normal.     Palpations: Abdomen is soft.  Musculoskeletal:        General: Normal range of  motion.     Cervical back: Normal range of motion and neck supple.  Skin:    General: Skin is warm.     Capillary Refill: Capillary refill takes less than 2 seconds.  Neurological:     General: No focal deficit present.     Mental Status: She is alert and oriented to person, place, and time.  Psychiatric:        Mood and Affect: Mood normal.        Behavior: Behavior normal.        Thought Content: Thought content normal.        Judgment: Judgment normal.     ED Results / Procedures / Treatments   Labs (all labs ordered are listed, but only abnormal results are displayed) Labs Reviewed  CBC WITH DIFFERENTIAL/PLATELET - Abnormal; Notable for the following components:      Result Value   RBC 3.83 (*)    Hemoglobin 9.7 (*)    HCT 32.4 (*)    MCH 25.3 (*)    MCHC 29.9 (*)    RDW 17.1 (*)    Platelets 520 (*)    All other components within normal limits  COMPREHENSIVE METABOLIC PANEL  - Abnormal; Notable for the following components:   Sodium 134 (*)    CO2 20 (*)    AST 14 (*)    All other components within normal limits  RETICULOCYTES - Abnormal; Notable for the following components:   RBC. 3.73 (*)    Immature Retic Fract 26.7 (*)    All other components within normal limits  RESP PANEL BY RT-PCR (RSV, FLU A&B, COVID)  RVPGX2  LIPASE, BLOOD  HCG, SERUM, QUALITATIVE  URINALYSIS, ROUTINE W REFLEX MICROSCOPIC  VITAMIN B12  FOLATE  IRON AND TIBC  FERRITIN    EKG EKG Interpretation Date/Time:  Tuesday November 21 2023 09:51:46 EDT Ventricular Rate:  82 PR Interval:  121 QRS Duration:  74 QT Interval:  383 QTC Calculation: 448 R Axis:   79  Text Interpretation: Sinus rhythm No significant change since last tracing Confirmed by Jacalyn Lefevre [phone]) on 11/21/2023 10:34:44 AM  Radiology DG Chest 2 View Result Date: 11/21/2023 CLINICAL DATA:  Chest pain. EXAM: CHEST - 2 VIEW COMPARISON:  Chest radiograph dated 02/15/2023. FINDINGS: The heart size and mediastinal contours are within normal limits. Both lungs are clear. The visualized skeletal structures are unremarkable. IMPRESSION: No active cardiopulmonary disease. Electronically Signed   By: Elgie Collard M.D.   On: 11/21/2023 11:03    Procedures Procedures    Medications Ordered in ED Medications  famotidine (PEPCID) IVPB 20 mg premix (0 mg Intravenous Stopped 11/21/23 0950)    ED Course/ Medical Decision Making/ A&P                                 Medical Decision Making Amount and/or Complexity of Data Reviewed Labs: ordered. Radiology: ordered.  Risk OTC drugs. Prescription drug management.   This patient presents to the ED for concern of painful swallowing, this involves an extensive number of treatment options, and is a complaint that carries with it a high risk of complications and morbidity.  The differential diagnosis includes gerd, cardiac, ulcer, esophageal abn   Co morbidities  that complicate the patient evaluation  Tob abuse   Additional history obtained:  Additional history obtained from epic chart review  Lab Tests:  I Ordered, and personally interpreted labs.  The pertinent results include:  covid/flu/rsv neg: CBC with hgb low at 9.7 (hgb 10.8 in 2021); cmp nl, anemia panel pending at d/c   Imaging Studies ordered:  I ordered imaging studies including cxr  I independently visualized and interpreted imaging which showed No active cardiopulmonary disease.  I agree with the radiologist interpretation  Medicines ordered and prescription drug management:  I ordered medication including pepcid  for sx  Reevaluation of the patient after these medicines showed that the patient improved I have reviewed the patients home medicines and have made adjustments as needed  Problem List / ED Course:  Anemia:  unclear etiology.  MCV nl.  Anemia panel pending.  Pt has access to my chart and will check results. Painful swallowing:  likely GERD, but she needs an EGD as she is a smoker.  She is started on prilosec and is given a referral to GI.  Return if worse.    Reevaluation:  After the interventions noted above, I reevaluated the patient and found that they have :improved   Social Determinants of Health:  Lives at home   Dispostion:  After consideration of the diagnostic results and the patients response to treatment, I feel that the patent would benefit from discharge with outpatient f/u.          Final Clinical Impression(s) / ED Diagnoses Final diagnoses:  Anemia, unspecified type  Gastroesophageal reflux disease, unspecified whether esophagitis present    Rx / DC Orders ED Discharge Orders          Ordered    Ambulatory referral to Gastroenterology       Comments: Painful swallowing   11/21/23 1048    omeprazole (PRILOSEC) 40 MG capsule  Daily        11/21/23 1049    ferrous sulfate 325 (65 FE) MG tablet  Daily        11/21/23 1109               Jacalyn Lefevre, MD 11/21/23 1111

## 2023-11-21 NOTE — ED Triage Notes (Signed)
 Patient is here for evaluation of reflux. Patient reports when she ingests or swallows she gets a pain in the lower area of her chest and upper abdomen. Patient reports mylanta helped but after a few days the pain came back. Reports taking one dose of omeprazole.
